# Patient Record
Sex: Male | Born: 1940 | Race: White | Hispanic: No | Marital: Married
Health system: Southern US, Community
[De-identification: ages and names within clinical notes are randomized; demographics above are authoritative.]

## PROBLEM LIST (undated history)

## (undated) DIAGNOSIS — T148XXA Other injury of unspecified body region, initial encounter: Secondary | ICD-10-CM

## (undated) DIAGNOSIS — K644 Residual hemorrhoidal skin tags: Secondary | ICD-10-CM

## (undated) DIAGNOSIS — F32A Depression, unspecified: Secondary | ICD-10-CM

## (undated) DIAGNOSIS — K219 Gastro-esophageal reflux disease without esophagitis: Secondary | ICD-10-CM

## (undated) DIAGNOSIS — J189 Pneumonia, unspecified organism: Secondary | ICD-10-CM

## (undated) DIAGNOSIS — J310 Chronic rhinitis: Secondary | ICD-10-CM

## (undated) DIAGNOSIS — E785 Hyperlipidemia, unspecified: Secondary | ICD-10-CM

## (undated) DIAGNOSIS — M109 Gout, unspecified: Secondary | ICD-10-CM

## (undated) DIAGNOSIS — R413 Other amnesia: Secondary | ICD-10-CM

## (undated) DIAGNOSIS — F329 Major depressive disorder, single episode, unspecified: Secondary | ICD-10-CM

## (undated) DIAGNOSIS — I2510 Atherosclerotic heart disease of native coronary artery without angina pectoris: Secondary | ICD-10-CM

## (undated) DIAGNOSIS — Z9889 Other specified postprocedural states: Secondary | ICD-10-CM

## (undated) DIAGNOSIS — I10 Essential (primary) hypertension: Secondary | ICD-10-CM

## (undated) DIAGNOSIS — M7550 Bursitis of unspecified shoulder: Secondary | ICD-10-CM

## (undated) DIAGNOSIS — R351 Nocturia: Secondary | ICD-10-CM

## (undated) DIAGNOSIS — M4300 Spondylolysis, site unspecified: Secondary | ICD-10-CM

## (undated) DIAGNOSIS — F419 Anxiety disorder, unspecified: Secondary | ICD-10-CM

## (undated) DIAGNOSIS — H269 Unspecified cataract: Secondary | ICD-10-CM

## (undated) DIAGNOSIS — Z972 Presence of dental prosthetic device (complete) (partial): Secondary | ICD-10-CM

## (undated) DIAGNOSIS — E663 Overweight: Secondary | ICD-10-CM

## (undated) DIAGNOSIS — R7303 Prediabetes: Secondary | ICD-10-CM

## (undated) DIAGNOSIS — K859 Acute pancreatitis without necrosis or infection, unspecified: Secondary | ICD-10-CM

## (undated) DIAGNOSIS — D126 Benign neoplasm of colon, unspecified: Secondary | ICD-10-CM

## (undated) DIAGNOSIS — T7840XA Allergy, unspecified, initial encounter: Secondary | ICD-10-CM

## (undated) DIAGNOSIS — Z8719 Personal history of other diseases of the digestive system: Secondary | ICD-10-CM

## (undated) HISTORY — DX: Presence of dental prosthetic device (complete) (partial): Z97.2

## (undated) HISTORY — DX: Gout, unspecified: M10.9

## (undated) HISTORY — DX: Essential (primary) hypertension: I10

## (undated) HISTORY — DX: Depression, unspecified: F32.A

## (undated) HISTORY — DX: Other specified postprocedural states: Z98.89

## (undated) HISTORY — DX: Pneumonia, unspecified organism: J18.9

## (undated) HISTORY — DX: Spondylolysis, site unspecified: M43.00

## (undated) HISTORY — DX: Acute pancreatitis without necrosis or infection, unspecified: K85.90

## (undated) HISTORY — DX: Gastro-esophageal reflux disease without esophagitis: K21.9

## (undated) HISTORY — DX: Hyperlipidemia, unspecified: E78.5

## (undated) HISTORY — PX: POLYPECTOMY: SHX149

## (undated) HISTORY — DX: Unspecified cataract: H26.9

## (undated) HISTORY — DX: Residual hemorrhoidal skin tags: K64.4

## (undated) HISTORY — DX: Other injury of unspecified body region, initial encounter: T14.8XXA

## (undated) HISTORY — DX: Atherosclerotic heart disease of native coronary artery without angina pectoris: I25.10

## (undated) HISTORY — PX: OTHER SURGICAL HISTORY: SHX169

## (undated) HISTORY — DX: Nocturia: R35.1

## (undated) HISTORY — DX: Bursitis of unspecified shoulder: M75.50

## (undated) HISTORY — PX: UMBILICAL HERNIA REPAIR: SHX196

## (undated) HISTORY — PX: TONSILLECTOMY: SUR1361

## (undated) HISTORY — DX: Chronic rhinitis: J31.0

## (undated) HISTORY — PX: CARPAL TUNNEL RELEASE: SHX101

## (undated) HISTORY — PX: UPPER GASTROINTESTINAL ENDOSCOPY: SHX188

## (undated) HISTORY — DX: Benign neoplasm of colon, unspecified: D12.6

## (undated) HISTORY — DX: Major depressive disorder, single episode, unspecified: F32.9

## (undated) HISTORY — DX: Allergy, unspecified, initial encounter: T78.40XA

## (undated) HISTORY — DX: Prediabetes: R73.03

## (undated) HISTORY — PX: CHOLECYSTECTOMY: SHX55

## (undated) HISTORY — PX: COLONOSCOPY: SHX174

## (undated) HISTORY — DX: Overweight: E66.3

## (undated) HISTORY — DX: Anxiety disorder, unspecified: F41.9

## (undated) HISTORY — DX: Personal history of other diseases of the digestive system: Z87.19

## (undated) HISTORY — DX: Other amnesia: R41.3

---

## 1982-02-03 HISTORY — PX: UPPER GI ENDOSCOPY: SHX6162

## 1997-06-01 ENCOUNTER — Ambulatory Visit (HOSPITAL_COMMUNITY): Admission: RE | Admit: 1997-06-01 | Discharge: 1997-06-01 | Payer: Self-pay | Admitting: Internal Medicine

## 1998-05-28 ENCOUNTER — Encounter: Payer: Self-pay | Admitting: Internal Medicine

## 1998-05-28 ENCOUNTER — Ambulatory Visit (HOSPITAL_COMMUNITY): Admission: RE | Admit: 1998-05-28 | Discharge: 1998-05-28 | Payer: Self-pay | Admitting: Internal Medicine

## 1999-01-08 ENCOUNTER — Ambulatory Visit (HOSPITAL_COMMUNITY): Admission: RE | Admit: 1999-01-08 | Discharge: 1999-01-08 | Payer: Self-pay | Admitting: Internal Medicine

## 1999-01-08 ENCOUNTER — Encounter: Payer: Self-pay | Admitting: Internal Medicine

## 2001-02-08 ENCOUNTER — Emergency Department (HOSPITAL_COMMUNITY): Admission: EM | Admit: 2001-02-08 | Discharge: 2001-02-08 | Payer: Self-pay | Admitting: Emergency Medicine

## 2001-02-08 ENCOUNTER — Encounter: Payer: Self-pay | Admitting: Emergency Medicine

## 2001-03-05 ENCOUNTER — Ambulatory Visit (HOSPITAL_BASED_OUTPATIENT_CLINIC_OR_DEPARTMENT_OTHER): Admission: RE | Admit: 2001-03-05 | Discharge: 2001-03-05 | Payer: Self-pay | Admitting: Internal Medicine

## 2001-10-29 ENCOUNTER — Encounter: Payer: Self-pay | Admitting: Cardiology

## 2001-10-30 ENCOUNTER — Inpatient Hospital Stay (HOSPITAL_COMMUNITY): Admission: AD | Admit: 2001-10-30 | Discharge: 2001-10-31 | Payer: Self-pay | Admitting: Cardiology

## 2002-03-07 ENCOUNTER — Encounter: Payer: Self-pay | Admitting: Emergency Medicine

## 2002-03-07 ENCOUNTER — Inpatient Hospital Stay (HOSPITAL_COMMUNITY): Admission: EM | Admit: 2002-03-07 | Discharge: 2002-03-08 | Payer: Self-pay | Admitting: Emergency Medicine

## 2002-03-08 ENCOUNTER — Encounter: Payer: Self-pay | Admitting: Internal Medicine

## 2002-04-07 ENCOUNTER — Encounter: Admission: RE | Admit: 2002-04-07 | Discharge: 2002-04-07 | Payer: Self-pay | Admitting: Internal Medicine

## 2002-04-08 ENCOUNTER — Encounter: Payer: Self-pay | Admitting: Internal Medicine

## 2002-04-08 ENCOUNTER — Encounter: Admission: RE | Admit: 2002-04-08 | Discharge: 2002-04-08 | Payer: Self-pay | Admitting: Internal Medicine

## 2002-09-01 ENCOUNTER — Encounter: Payer: Self-pay | Admitting: Internal Medicine

## 2002-12-12 ENCOUNTER — Ambulatory Visit (HOSPITAL_COMMUNITY): Admission: RE | Admit: 2002-12-12 | Discharge: 2002-12-12 | Payer: Self-pay | Admitting: Internal Medicine

## 2003-02-04 HISTORY — PX: CERVICAL SPINE SURGERY: SHX589

## 2003-02-04 HISTORY — PX: SPINE SURGERY: SHX786

## 2003-08-04 ENCOUNTER — Encounter: Admission: RE | Admit: 2003-08-04 | Discharge: 2003-08-04 | Payer: Self-pay | Admitting: Orthopaedic Surgery

## 2003-12-08 ENCOUNTER — Ambulatory Visit: Payer: Self-pay | Admitting: Internal Medicine

## 2004-05-06 ENCOUNTER — Ambulatory Visit: Payer: Self-pay | Admitting: Internal Medicine

## 2004-07-02 ENCOUNTER — Ambulatory Visit: Payer: Self-pay | Admitting: Internal Medicine

## 2004-07-05 ENCOUNTER — Ambulatory Visit: Payer: Self-pay | Admitting: Internal Medicine

## 2004-07-09 ENCOUNTER — Ambulatory Visit: Payer: Self-pay

## 2004-07-10 ENCOUNTER — Ambulatory Visit: Payer: Self-pay | Admitting: Cardiology

## 2004-07-22 ENCOUNTER — Ambulatory Visit (HOSPITAL_COMMUNITY): Admission: RE | Admit: 2004-07-22 | Discharge: 2004-07-22 | Payer: Self-pay | Admitting: Neurology

## 2004-08-13 ENCOUNTER — Ambulatory Visit: Payer: Self-pay

## 2004-08-21 ENCOUNTER — Ambulatory Visit: Payer: Self-pay | Admitting: Cardiology

## 2004-08-27 ENCOUNTER — Inpatient Hospital Stay (HOSPITAL_COMMUNITY): Admission: EM | Admit: 2004-08-27 | Discharge: 2004-08-29 | Payer: Self-pay | Admitting: Emergency Medicine

## 2004-08-28 ENCOUNTER — Ambulatory Visit: Payer: Self-pay | Admitting: Internal Medicine

## 2004-09-10 ENCOUNTER — Ambulatory Visit: Payer: Self-pay | Admitting: Internal Medicine

## 2004-10-23 ENCOUNTER — Ambulatory Visit: Payer: Self-pay | Admitting: Cardiology

## 2005-03-04 ENCOUNTER — Ambulatory Visit: Payer: Self-pay | Admitting: Cardiology

## 2005-03-31 ENCOUNTER — Ambulatory Visit: Payer: Self-pay | Admitting: Cardiology

## 2005-03-31 ENCOUNTER — Ambulatory Visit: Payer: Self-pay

## 2005-05-30 ENCOUNTER — Ambulatory Visit: Payer: Self-pay | Admitting: Cardiology

## 2005-07-24 ENCOUNTER — Ambulatory Visit: Payer: Self-pay | Admitting: Emergency Medicine

## 2005-07-24 ENCOUNTER — Inpatient Hospital Stay (HOSPITAL_COMMUNITY): Admission: RE | Admit: 2005-07-24 | Discharge: 2005-08-11 | Payer: Self-pay | Admitting: Orthopaedic Surgery

## 2005-07-27 ENCOUNTER — Ambulatory Visit: Payer: Self-pay | Admitting: Infectious Diseases

## 2005-08-07 ENCOUNTER — Ambulatory Visit: Payer: Self-pay | Admitting: Physical Medicine & Rehabilitation

## 2005-08-11 ENCOUNTER — Inpatient Hospital Stay (HOSPITAL_COMMUNITY)
Admission: RE | Admit: 2005-08-11 | Discharge: 2005-08-15 | Payer: Self-pay | Admitting: Physical Medicine & Rehabilitation

## 2005-09-01 ENCOUNTER — Encounter: Payer: Self-pay | Admitting: Cardiology

## 2005-10-16 ENCOUNTER — Ambulatory Visit: Payer: Self-pay | Admitting: Internal Medicine

## 2005-11-11 ENCOUNTER — Ambulatory Visit: Payer: Self-pay | Admitting: Internal Medicine

## 2005-11-24 ENCOUNTER — Ambulatory Visit: Payer: Self-pay | Admitting: Internal Medicine

## 2007-04-13 ENCOUNTER — Encounter: Payer: Self-pay | Admitting: *Deleted

## 2007-04-13 DIAGNOSIS — M109 Gout, unspecified: Secondary | ICD-10-CM

## 2007-04-13 DIAGNOSIS — J309 Allergic rhinitis, unspecified: Secondary | ICD-10-CM | POA: Insufficient documentation

## 2007-04-13 DIAGNOSIS — I10 Essential (primary) hypertension: Secondary | ICD-10-CM | POA: Insufficient documentation

## 2007-04-13 DIAGNOSIS — Z9089 Acquired absence of other organs: Secondary | ICD-10-CM | POA: Insufficient documentation

## 2007-04-13 DIAGNOSIS — Z9889 Other specified postprocedural states: Secondary | ICD-10-CM

## 2007-04-13 DIAGNOSIS — D126 Benign neoplasm of colon, unspecified: Secondary | ICD-10-CM

## 2007-04-13 DIAGNOSIS — I2510 Atherosclerotic heart disease of native coronary artery without angina pectoris: Secondary | ICD-10-CM | POA: Insufficient documentation

## 2007-04-13 DIAGNOSIS — M67919 Unspecified disorder of synovium and tendon, unspecified shoulder: Secondary | ICD-10-CM | POA: Insufficient documentation

## 2007-04-13 DIAGNOSIS — Z9861 Coronary angioplasty status: Secondary | ICD-10-CM | POA: Insufficient documentation

## 2007-04-13 DIAGNOSIS — Z8719 Personal history of other diseases of the digestive system: Secondary | ICD-10-CM | POA: Insufficient documentation

## 2007-04-13 DIAGNOSIS — K644 Residual hemorrhoidal skin tags: Secondary | ICD-10-CM | POA: Insufficient documentation

## 2007-04-13 DIAGNOSIS — Z9189 Other specified personal risk factors, not elsewhere classified: Secondary | ICD-10-CM | POA: Insufficient documentation

## 2007-04-13 DIAGNOSIS — M719 Bursopathy, unspecified: Secondary | ICD-10-CM

## 2007-04-13 DIAGNOSIS — E785 Hyperlipidemia, unspecified: Secondary | ICD-10-CM | POA: Insufficient documentation

## 2007-04-13 DIAGNOSIS — R413 Other amnesia: Secondary | ICD-10-CM | POA: Insufficient documentation

## 2007-04-13 DIAGNOSIS — F411 Generalized anxiety disorder: Secondary | ICD-10-CM | POA: Insufficient documentation

## 2007-04-13 DIAGNOSIS — K219 Gastro-esophageal reflux disease without esophagitis: Secondary | ICD-10-CM | POA: Insufficient documentation

## 2007-08-24 ENCOUNTER — Ambulatory Visit: Payer: Self-pay | Admitting: Gastroenterology

## 2007-08-24 DIAGNOSIS — R159 Full incontinence of feces: Secondary | ICD-10-CM | POA: Insufficient documentation

## 2007-09-07 ENCOUNTER — Ambulatory Visit (HOSPITAL_COMMUNITY): Admission: RE | Admit: 2007-09-07 | Discharge: 2007-09-07 | Payer: Self-pay | Admitting: Gastroenterology

## 2007-09-07 ENCOUNTER — Encounter: Payer: Self-pay | Admitting: Internal Medicine

## 2007-09-08 ENCOUNTER — Encounter: Payer: Self-pay | Admitting: Gastroenterology

## 2007-09-13 ENCOUNTER — Ambulatory Visit: Payer: Self-pay | Admitting: Gastroenterology

## 2008-01-30 IMAGING — CR DG CHEST 2V
2 series · 2 of 2 positions shown · non-contrast
Comparison: none

CLINICAL DATA: Cervical spondylosis.  Preoperative.
 CHEST ? 2 VIEW:

[view not recorded (1 of 2)]
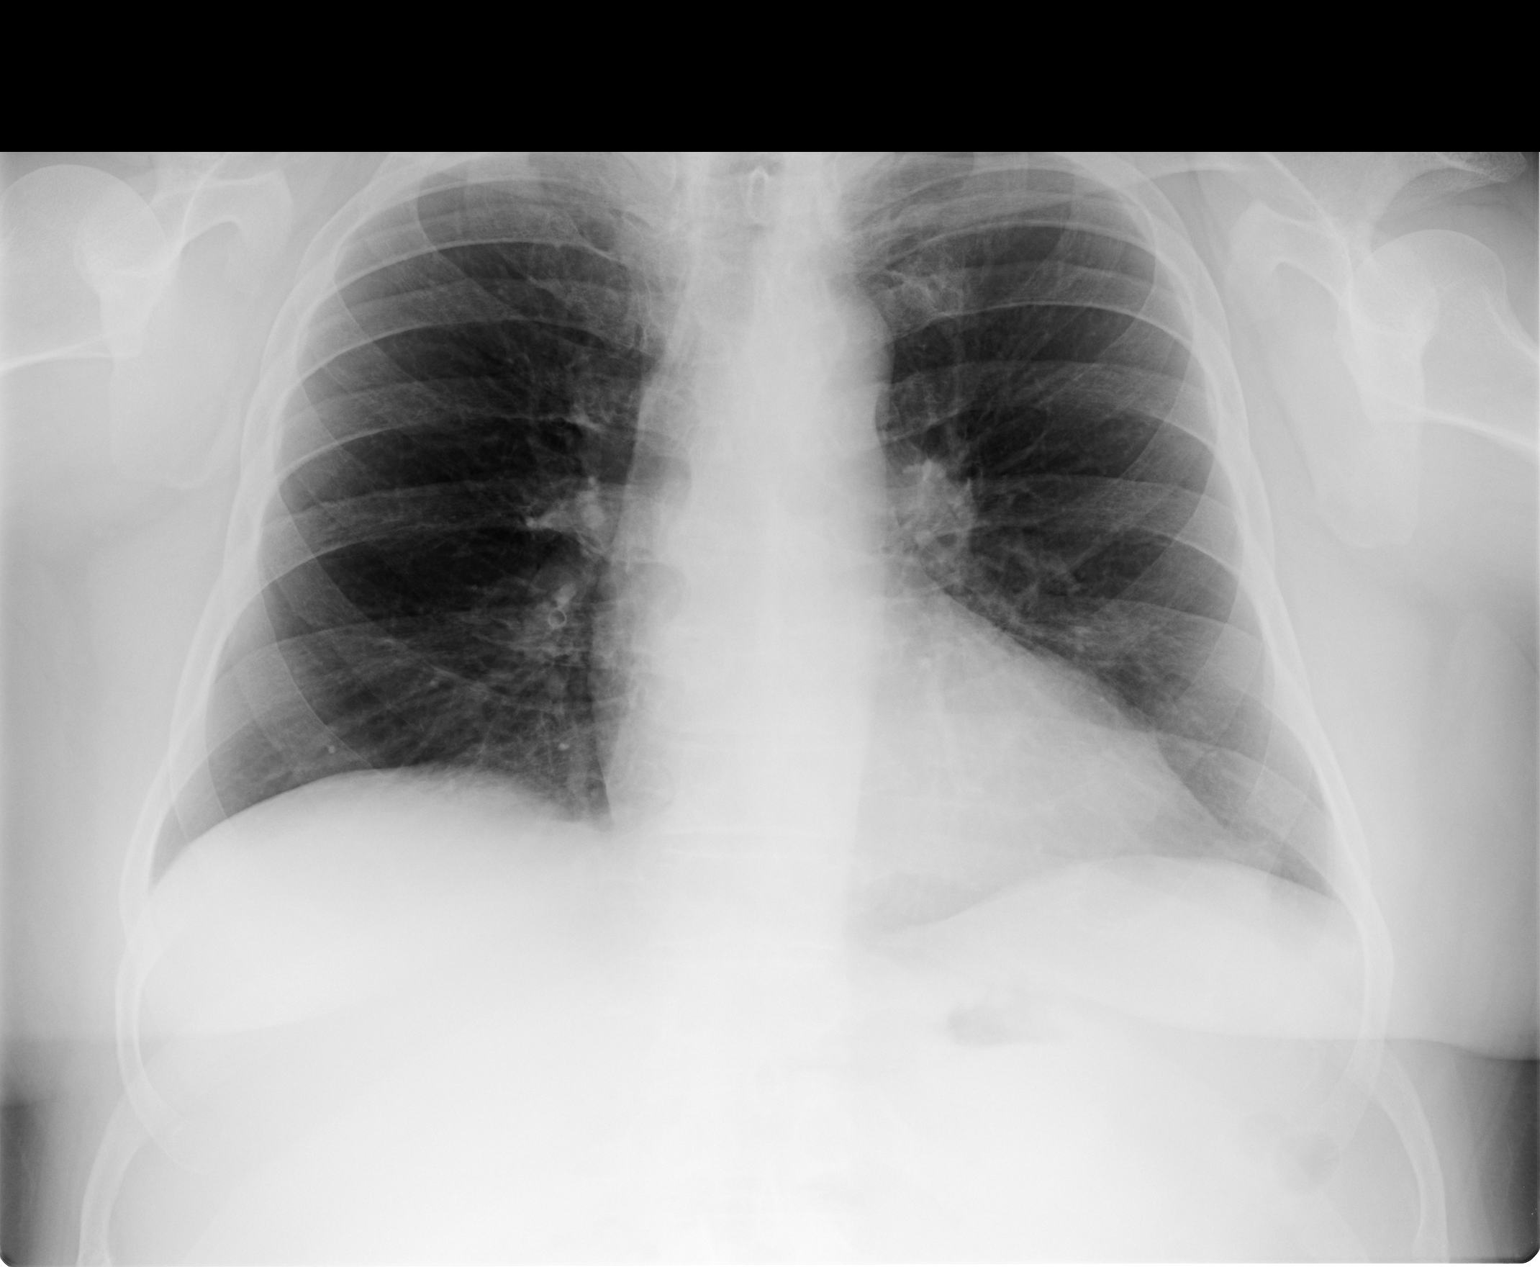

[view not recorded (2 of 2)]
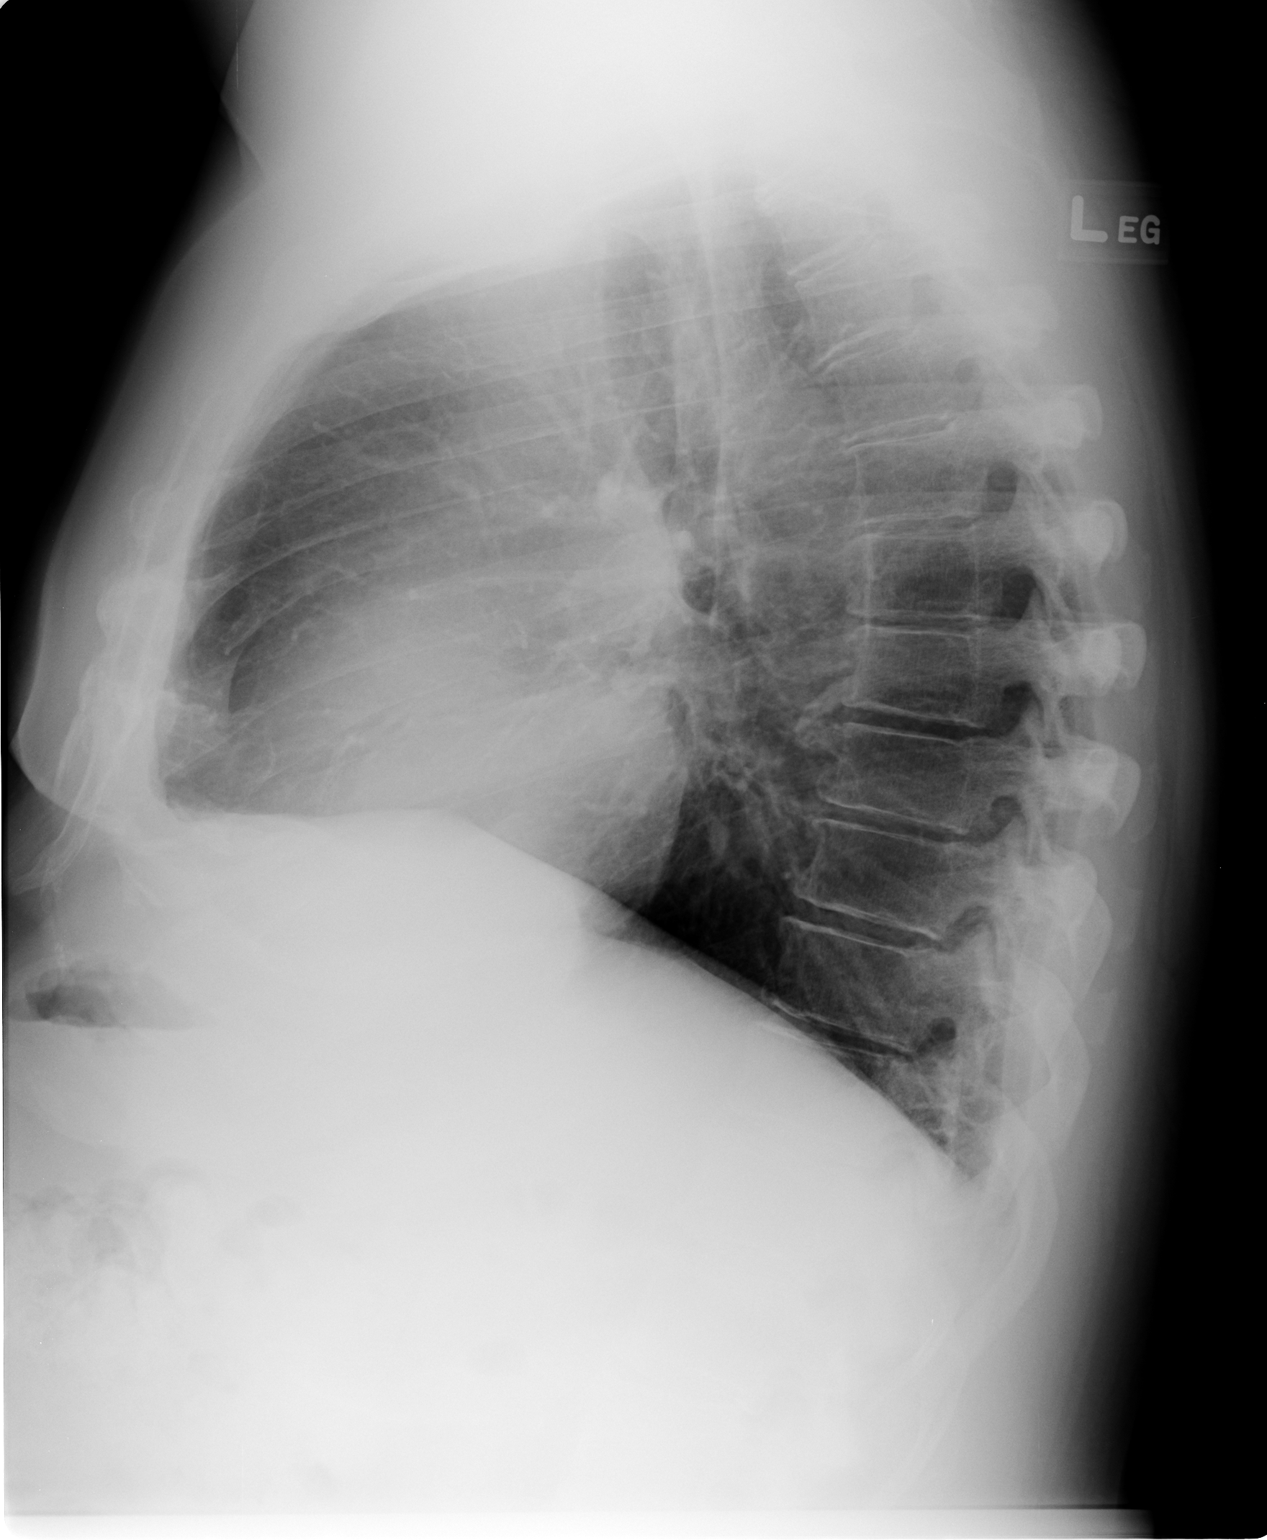

[2 of 2 positions shown; findings below may reference images not displayed]

FINDINGS: The lungs are clear and the heart and mediastinal structures are normal.
IMPRESSION: No evidence for active chest disease.

## 2008-05-04 ENCOUNTER — Encounter: Payer: Self-pay | Admitting: Cardiology

## 2008-08-03 ENCOUNTER — Ambulatory Visit: Payer: Self-pay | Admitting: Cardiology

## 2008-08-10 ENCOUNTER — Encounter: Payer: Self-pay | Admitting: Cardiology

## 2008-08-10 ENCOUNTER — Ambulatory Visit: Payer: Self-pay | Admitting: Cardiology

## 2008-11-20 DIAGNOSIS — E663 Overweight: Secondary | ICD-10-CM | POA: Insufficient documentation

## 2009-03-27 ENCOUNTER — Ambulatory Visit: Payer: Self-pay | Admitting: Cardiology

## 2010-01-17 ENCOUNTER — Encounter: Payer: Self-pay | Admitting: Cardiology

## 2010-02-05 ENCOUNTER — Telehealth (INDEPENDENT_AMBULATORY_CARE_PROVIDER_SITE_OTHER): Payer: Self-pay | Admitting: *Deleted

## 2010-02-05 ENCOUNTER — Encounter: Payer: Self-pay | Admitting: Cardiology

## 2010-02-06 ENCOUNTER — Encounter: Payer: Self-pay | Admitting: Cardiology

## 2010-02-11 ENCOUNTER — Encounter: Payer: Self-pay | Admitting: Cardiology

## 2010-02-19 ENCOUNTER — Ambulatory Visit
Admission: RE | Admit: 2010-02-19 | Discharge: 2010-02-19 | Payer: Self-pay | Source: Home / Self Care | Attending: Cardiology | Admitting: Cardiology

## 2010-02-19 ENCOUNTER — Encounter: Payer: Self-pay | Admitting: Cardiology

## 2010-02-19 ENCOUNTER — Encounter (INDEPENDENT_AMBULATORY_CARE_PROVIDER_SITE_OTHER): Payer: Self-pay | Admitting: *Deleted

## 2010-02-19 DIAGNOSIS — R0602 Shortness of breath: Secondary | ICD-10-CM | POA: Insufficient documentation

## 2010-02-19 DIAGNOSIS — R9430 Abnormal result of cardiovascular function study, unspecified: Secondary | ICD-10-CM | POA: Insufficient documentation

## 2010-02-20 ENCOUNTER — Encounter: Payer: Self-pay | Admitting: Cardiology

## 2010-02-21 ENCOUNTER — Ambulatory Visit
Admission: RE | Admit: 2010-02-21 | Payer: Self-pay | Source: Home / Self Care | Attending: Cardiology | Admitting: Cardiology

## 2010-02-24 ENCOUNTER — Encounter: Payer: Self-pay | Admitting: Physical Medicine & Rehabilitation

## 2010-02-25 LAB — POCT I-STAT 3, ART BLOOD GAS (G3+)
O2 Saturation: 97 %
TCO2: 27 mmol/L (ref 0–100)
pH, Arterial: 7.453 — ABNORMAL HIGH (ref 7.350–7.450)
pO2, Arterial: 86 mmHg (ref 80.0–100.0)

## 2010-02-25 LAB — POCT I-STAT 3, VENOUS BLOOD GAS (G3P V)
Acid-Base Excess: 2 mmol/L (ref 0.0–2.0)
Acid-Base Excess: 3 mmol/L — ABNORMAL HIGH (ref 0.0–2.0)
Bicarbonate: 28 mEq/L — ABNORMAL HIGH (ref 20.0–24.0)
O2 Saturation: 64 %
pCO2, Ven: 49.7 mmHg (ref 45.0–50.0)
pH, Ven: 7.373 — ABNORMAL HIGH (ref 7.250–7.300)
pO2, Ven: 28 mmHg — CL (ref 30.0–45.0)

## 2010-02-26 ENCOUNTER — Ambulatory Visit (HOSPITAL_COMMUNITY)
Admission: RE | Admit: 2010-02-26 | Discharge: 2010-02-27 | Payer: Self-pay | Source: Home / Self Care | Attending: Cardiovascular Disease | Admitting: Cardiovascular Disease

## 2010-02-27 ENCOUNTER — Encounter: Payer: Self-pay | Admitting: Cardiology

## 2010-02-27 LAB — BASIC METABOLIC PANEL
CO2: 28 mEq/L (ref 19–32)
Calcium: 9 mg/dL (ref 8.4–10.5)
Chloride: 105 mEq/L (ref 96–112)
Chloride: 106 mEq/L (ref 96–112)
Creatinine, Ser: 1.17 mg/dL (ref 0.4–1.5)
Creatinine, Ser: 1.2 mg/dL (ref 0.4–1.5)
GFR calc Af Amer: >60 mL/min (ref >60)
GFR calc non Af Amer: >60 mL/min (ref >60)
Glucose, Bld: 143 mg/dL — ABNORMAL HIGH (ref 70–99)
Glucose, Bld: 146 mg/dL — ABNORMAL HIGH (ref 70–99)
Potassium: 4.6 mEq/L (ref 3.5–5.1)
Sodium: 140 mEq/L (ref 135–145)

## 2010-02-27 LAB — CBC
HCT: 44 % (ref 39.0–52.0)
MCH: 30.1 pg (ref 26.0–34.0)
MCHC: 32.9 g/dL (ref 30.0–36.0)
Platelets: 243 10*3/uL (ref 150–400)
RDW: 12.9 % (ref 11.5–15.5)
WBC: 7.7 10*3/uL (ref 4.0–10.5)
WBC: 9.2 10*3/uL (ref 4.0–10.5)

## 2010-02-28 LAB — POCT ACTIVATED CLOTTING TIME: Activated Clotting Time: 505 seconds

## 2010-02-28 NOTE — Procedures (Addendum)
  NAMEREMBERTO, LINEN NO.:  [id number]  MEDICAL RECORD NO.:  [id number]          PATIENT TYPE:  OIB  LOCATION:  1966                         FACILITY:  [facility]  PHYSICIAN:  Minus Breeding, MD, FACCDATE OF BIRTH:  1941-01-08  DATE OF PROCEDURE:  02/21/2010 DATE OF DISCHARGE:  02/21/2010                           CARDIAC CATHETERIZATION   PRIMARY CARE PHYSICIAN:  Dr. Olena Heckle.  CARDIOLOGIST:  Ernestine Mcmurray, MD, [facility].  PROCEDURE:  Left and right heart catheterization/coronary arteriography.  INDICATIONS:  Evaluate the patient with dyspnea and an abnormal stress perfusion study suggesting mid-to-basilar inferolateral defect.  PROCEDURAL NOTE:  Left heart catheterization performed via the right femoral artery, right heart catheterization was performed via the right femoral vein.  Both vessels were cannulated using anterior wall puncture.  A #4-French arterial sheath and a #7-French venous sheath were inserted via the modified Seldinger technique.  Preformed Judkins and pigtail catheter were utilized.  The patient tolerated the procedure well, left the lab in stable condition.  RESULTS:  Hemodynamics:  RA mean 9, RV 23/8, PA 24/14 with a mean of 19, pulmonary capillary pressure mean 9, AO 120/92, LV 132/16, cardiac output/cardiac index 3.2/1.4.  Coronaries:  Left main was normal.  The LAD had a long proximal 60% calcified stenosis.  The vessel did not wrap the apex.  This was slightly progressed compared with the 2003 examination.  First diagonal was large with mid long 25% stenosis.  Circumflex in the AV groove was normal proximally.  There was mid obtuse marginal, which was large and branching.  Superior branch was normal.  The inferior branch had 25% stenosis.  The right coronary artery was a large vessel with a proximal mid stent, which was patent.  At the distal end of the stent, there was approximately 75% stenosis.  PDA had ostial 50% stenosis and was  large. Posterolateral had ostial 40% stenosis and was large.  Left Ventriculogram:  The left ventriculogram was obtained in the RAO projection.  EF was 65% with normal wall motion.  CONCLUSION:  Significant right coronary artery stenosis in the phase of an abnormal stress perfusion study and symptoms.  The patient will be brought back for percutaneous revascularization.  The films were repaired with Dr. Dannielle Burn and Dr. Burt Knack.     Minus Breeding, MD, [facility]     JH/MEDQ  D:  02/21/2010  T:  02/22/2010  Job:  [id number]  cc:   Dr. Olena Heckle.  Electronically Signed by Minus Breeding MD [facility] on 02/28/2010 12:29:54 PM

## 2010-03-04 ENCOUNTER — Telehealth (INDEPENDENT_AMBULATORY_CARE_PROVIDER_SITE_OTHER): Payer: Self-pay | Admitting: *Deleted

## 2010-03-05 NOTE — Assessment & Plan Note (Signed)
Summary: 6 mo fu per jan reminder-srs  Medications Added COZAAR 100 MG TABS (LOSARTAN POTASSIUM) Take 1 tablet by mouth once a day      Allergies Added: NKDA  Visit Type:  Follow-up Primary Provider:  Kern Alberta, MD  CC:  follow-up visit.  History of Present Illness: the patient is a 70 year old male with a history of catheterization in 2003. The patient has coronary disease requiring stent placement to a right coronary artery lesion. He had nonobstructive disease in the LAD and circumflex. The patient denies any recurrent chest pain. He denies shortness of breath orthopnea PND he has no palpitations or syncope.  The patient reports pain in the lower extremities upon walking but only occasionally and not always reproducible. He reports no sensation abnormalities in the lower extremities. He has no back pain.  The patient reports no ongoing anxiety. His sleep pattern has improved.  Clinical Review Panels:  CXR CXR results Mild cardiac prominence.   Normal mediastinal contours and pulmonary vascularity.   Minimal chronic elevation right diaphragm.   Linear subsegmental atelectasis right base.   Lungs otherwise clear.   Significant improvement in aeration at lung bases since previous exam.   Multiple cardiac monitoring lines project over chest.   No pneumothorax.   Prior cervical spine fusion.    IMPRESSION:   Improved bibasilar aeration. (08/08/2005)  Stress Echocardiogram Stress Echocardiogram Conclusions:         1. The study quality is fair.  The study is technically limited due to         patient body habitus.          2. Patient followed a Bruce protocol. The patient exercised into stage         3. The total exercise duration was 06:07 minutes with adequte heart         rate achieved. There are non-specific ST-segment changes. The blood         pressure response is hypertensive. The patient did not express         feelings of chest discomfort. The patient achieved a  level of 7 METS.         3. This was an equivocal echocardiographic stress test with limited        apical views.  At rest there is possible apical inferior wall segment         hypokinesis (score 2). The estimated ejection fraction is 55-60%.          With stress the left ventricle appears hyperdynamic. The estimated         ejection fraction is greater than 65%.  The apical inferior wall         segment is unchanged based on limited views. (08/10/2008)  Cardiac Imaging Cardiac Cath Findings   CONCLUSION:  Successful percutaneous stenting of the right coronary artery.   DISPOSITION:  The patient had a drug eluding Cypher stent. Oral Plavix for  at least three months will be recommended. Aggressive medical management for  risk reduction and exercise tolerance testing to assess ischemia in other  myocardial territories as well as the distal right coronary will be  recommended.                                            Loretha Brasil. Lia Foyer, M.D. [facility] (10/29/2001)    Preventive Screening-Counseling & Management  Alcohol-Tobacco     Smoking Status: never  Current Medications (verified): 1)  Probenecid 500 Mg  Tabs (Probenecid) .... Take One Tablet Two Times A Day 2)  Fish Oil   Oil (Fish Oil) .... Take Daily As Directed. 3)  Cozaar 100 Mg Tabs (Losartan Potassium) .... Take 1 Tablet By Mouth Once A Day 4)  Aspirin 81 Mg  Tbec (Aspirin) .... Once Daily 5)  Trazodone Hcl 50 Mg  Tabs (Trazodone Hcl) .... Take Two Times A Day 6)  Zetia 10 Mg  Tabs (Ezetimibe) .... Take One Tablet Once Daily 7)  Lorazepam 1 Mg  Tabs (Lorazepam) .... Take One Tablet As Needed 8)  Zocor 40 Mg  Tabs (Simvastatin) .... Once Daily 9)  Omeprazole 20 Mg  Cpdr (Omeprazole) .... Two Times A Day 10)  Anusol-Hc 25 Mg  Supp (Hydrocortisone Acetate) .... One Suppository  P.o. R. Q.h.s. 11)  Citalopram Hydrobromide 20 Mg Tabs (Citalopram Hydrobromide) .... Take 1 Tablet By Mouth Once A Day  Allergies (verified): No  Known Drug Allergies  Comments:  Nurse/Medical Assistant: The patient's medications and allergies were reviewed with the patient and were updated in the Medication and Allergy Lists. List reviewed.  Past History:  Family History: Last updated: 08/24/2007 Family History of Clotting disorder: Father Family History of Diabetes: Mother Family History of Heart Disease: Mother  Social History: Last updated: 08/24/2007 Occupation: Disabled Patient has never smoked.  Alcohol Use - no Daily Caffeine Use - 2 cups coffee Illicit Drug Use - no  Risk Factors: Smoking Status: never (03/27/2009)  Review of Systems  The patient denies fatigue, malaise, fever, weight gain/loss, vision loss, decreased hearing, hoarseness, chest pain, palpitations, shortness of breath, prolonged cough, wheezing, sleep apnea, coughing up blood, abdominal pain, blood in stool, nausea, vomiting, diarrhea, heartburn, incontinence, blood in urine, muscle weakness, joint pain, leg swelling, rash, skin lesions, headache, fainting, dizziness, depression, anxiety, enlarged lymph nodes, easy bruising or bleeding, and environmental allergies.    Vital Signs:  Patient profile:   70 year old male Height:      68 inches Weight:      251 pounds BMI:     38.30 Pulse rate:   71 / minute BP sitting:   115 / 74  (left arm) Cuff size:   large  Vitals Entered By: Georgina Peer (March 27, 2009 11:15 AM)  Nutrition Counseling: Patient's BMI is greater than 25 and therefore counseled on weight management options. CC: follow-up visit   Physical Exam  Additional Exam:  General: Well-developed, well-nourished in no distress head: Normocephalic and atraumatic eyes PERRLA/EOMI intact, conjunctiva and lids normal nose: No deformity or lesions mouth normal dentition, normal posterior pharynx neck: Supple, no JVD.  No masses, thyromegaly or abnormal cervical nodes lungs: Normal breath sounds bilaterally without wheezing.   Normal percussion heart: regular rate and rhythm with normal S1 and S2, no S3 or S4.  PMI is normal.  No pathological murmurs abdomen: Normal bowel sounds, abdomen is soft and nontender without masses, organomegaly or hernias noted.  No hepatosplenomegaly musculoskeletal: Back normal, normal gait muscle strength and tone normal pulsus: Pulse is normal in all 4 extremities Extremities: No peripheral pitting edema neurologic: Alert and oriented x 3 skin: Intact without lesions or rashes cervical nodes: No significant adenopathy psychologic: Normal affect    EKG  Procedure date:  03/27/2009  Findings:      normal sinus rhythm. Normal EKG. Heart rate 69 beats per minute.  Impression & Recommendations:  Problem #  1:  PERCUTANEOUS TRANSLUMINAL CORONARY ANGIOPLASTY, HX OF (ICD-V45.82) the patient reports no recurrent chest pain. His electrocardiogram is within normal limits. His compliant with his medications. There is no definite indication for stress testing currently.  Problem # 2:  ANXIETY, CHRONIC (ICD-300.00) this problem is much improved. The patient is followed by Dr. Quillian Quince.  Problem # 3:  HYPERTENSION (ICD-401.9) patient blood pressure within normal range. I made no changes in his medical regimen. His updated medication list for this problem includes:    Cozaar 100 Mg Tabs (Losartan potassium) .Marland Kitchen... Take 1 tablet by mouth once a day    Aspirin 81 Mg Tbec (Aspirin) ..... Once daily  Other Orders: EKG w/ Interpretation (93000)  Patient Instructions: 1)  Your physician recommends that you continue on your current medications as directed. Please refer to the Current Medication list given to you today. 2)  Follow up in  1 year.

## 2010-03-05 NOTE — Discharge Summary (Addendum)
NAMEBERKELEY, GOCHNOUR                ACCOUNT NO.:  [id number]  MEDICAL RECORD NO.:  [id number]          PATIENT TYPE:  OIB  LOCATION:  6527                         FACILITY:  [facility]  PHYSICIAN:  Juanda Bond. Burt Knack, MD  DATE OF BIRTH:  May 29, 1940  DATE OF ADMISSION:  02/26/2010 DATE OF DISCHARGE:  02/27/2010                              DISCHARGE SUMMARY   DISCHARGE DIAGNOSES: 1. Shortness of breath, felt anginal equivalent with diagnostic     catheterization on February 21, 2010, demonstrating significant     right coronary artery stenosis.     a.     Interventional cardiac catheterization performed on February 26, 2010 resulting in successful percutaneous coronary      intervention/Promus drug-eluting stent placement to the proximal      as well as mid right coronary artery. 2. Prior history of coronary artery disease with history of stenting     to the right coronary artery in 2003. 3. Ejection fraction of 65% by diagnostic catheterization on February 21, 2010. 4. Obesity. 5. Hyperlipidemia. 6. Gastroesophageal reflux disease. 7. Hypertension. 8. Elevated blood glucose, for outpatient evaluation for risk of     diabetes.  CBG was 146 here in the hospital.  SECONDARY DIAGNOSES: 1. History of cervical spine surgery complicated by evacuation of     hematoma with prolonged intubation in 2007. 2. Hemorrhoids. 3. Umbilical hernia repair. 4. Tonsillectomy. 5. Carpal tunnel release. 6. Gout. 7. Diverticulosis. 8. History of pancreatitis. 9. Chronic anxiety and depression.  HOSPITAL COURSE:  Mr. Fjerstad is a 70 year old gentleman with a history of hypertension and hyperlipidemia who had a stent placed in the RCA in 2003.  He presented with exertional dyspnea which was felt to be as anginal equivalent and had inferior ischemia on stress testing.  He underwent diagnostic catheterization on February 21, 2010, was found to have significant RCA stenosis.  EF was 65% at that visit.   He was started on Plavix as an outpatient as well as Imdur prior to the diagnostic catheterization and referred back for intervention on February 26, 2010.  Dr. Burt Knack performed successful PCI of the lesions in the proximal mid RCA using two Promus drug-eluting stents.  The patient should be continued on dual antiplatelet therapy with aspirin and Plavix for minimum of 1 year per his recommendations.  The patient tolerated the procedure well and ambulated without difficulty.  Dr. Burt Knack seen and examined him today and feels he is stable for discharge.  His home medicines will be continued, and after discussion with Dr. Burt Knack, the patient will also be started on metoprolol given his history of coronary artery disease as there is no other apparent contraindication at present.  DISCHARGE LABS:  WBC 9.2, hemoglobin 14, hematocrit 44, platelet count 243.  Sodium 141, potassium 5, chloride 106, CO2 28, glucose 46, BUN 11, creatinine 1.20.  STUDIES: 1. Cardiac catheterization on February 26, 2010, please see HPI for     summary as well as full report for details.  DISCHARGE MEDICATIONS: 1. Aspirin 81 mg daily. 2. Metoprolol tartrate 25 mg b.i.d.  3. Multaq Allergy one tablet every morning. 4. Cozaar 100 mg every morning. 5. Fish oil 1200 mg b.i.d. 6. Lasix 20 mg every morning. 7. Imdur 30 mg every morning. 8. Nitroglycerin sublingual 0.4 mg every 5 minutes as needed up to     three doses. 9. Plavix 75 mg every morning. 10.Probenecid/colchicine 500/0.5 mg b.i.d. 11.Vitamin B12 2500 mcg every morning. 12.Vitamin C 500 mg every morning. 13.Vitamin D3 400 units daily. 14.Vitamin E 400 units every morning. 15.Zocor 40 mg nightly.  DISPOSITION:  Mr. Blache will be discharged in stable condition to home. He is instructed not to lift anything or participate in sexual activity for 1 week or drive for 2 days.  He is to follow a low-sodium, heart- healthy diet and if he notices any pain,  swelling, bleeding or pus at his cath site, he is to call or return.  He is instructed to follow up with his primary care provider in the near future to evaluate for the rest of diabetes given his somewhat elevated blood sugar while here in the hospital.  A1c was not drawn.  He will also follow up with Dr. Dannielle Burn on March 19, 2010 at 8:30 a.m.  DURATION OF DISCHARGE ENCOUNTER:  Greater than 30 minutes including physician and PA time.     Melina Copa, P.A.C.   ______________________________ Juanda Bond. Burt Knack, MD    DD/MEDQ  D:  02/27/2010  T:  02/27/2010  Job:  [id number]  cc:   Ernestine Mcmurray, MD,FACC [facility]  Electronically Signed by Sherren Mocha MD on 03/05/2010 04:56:16 AM Electronically Signed by Melina Copa  on 03/06/2010 [id number] PM

## 2010-03-05 NOTE — Procedures (Signed)
Luis Wolfe, Luis Wolfe                ACCOUNT NO.:  [id number]  MEDICAL RECORD NO.:  [id number]          PATIENT TYPE:  OIB  LOCATION:  6527                         FACILITY:  [facility]  PHYSICIAN:  Juanda Bond. Burt Knack, MD  DATE OF BIRTH:  [birth year]  DATE OF PROCEDURE:  02/26/2010 DATE OF DISCHARGE:                           CARDIAC CATHETERIZATION   PROCEDURES: 1. Percutaneous transluminal coronary angioplasty and stenting of the     right coronary artery. 2. Perclose of the right femoral artery.  PROCEDURAL INDICATIONS:  Luis Wolfe is a 70 year old gentleman with known CAD and he has undergone previous stenting of the right coronary artery several years ago.  He presented with exertional dyspnea and was found to have inferior ischemia on stress testing.  He had significant right coronary artery stenosis with severe stenosis of the distal edge of the stent in the mid right coronary artery and moderately severe stenosis on the proximal aspect of the same stent in the proximal right coronary artery.  He was brought in for PCI in the setting of dyspnea and inducible ischemia on stress testing.  Risks and indications of procedure were reviewed with the patient. Informed consent was obtained.  The patient has been previously preloaded with Plavix and has been taking this medication daily.  Right groin was prepped, draped, and anesthetized with 1% lidocaine using modified Seldinger technique.  A 6-French sheath was placed in the right femoral artery.  Bivalirudin was used for anticoagulation.  A JR-4 guide catheter was inserted.  Once therapeutic ACT was achieved, a Cougar guidewire was advanced beyond the area of stenosis in the proximal mid vessel into the posterolateral branch.  The vessel was predilated with a 2.5 x 12 mm apex balloon which was taken to 12 atmospheres on 2 inflations.  A Promus drug-eluting stent was carefully positioned over the severe lesion of the distal edge of the  stent in the mid vessel and was deployed at 14 atmospheres.  This was a 2.75 x 16 mm stent.  The stent appeared well expanded.  The stent was then postdilated with a 3.0 x 15 mm Swan Quantum apex balloon which was taken to 20 atmospheres maximum inflation.  Following that portion of the procedure, there was an excellent angiographic result with 0% residual stenosis and TIMI 3 flow.  However, the proximal vessel clearly appeared hemodynamically significant at that point.  The guide catheter had to be deep seated in order to get the first stent down into the lesion.  There was no clear dissection, but I felt the vessel warranted treatment to keep the keep adequate inflow into the newly placed stent.  Therefore, the vessel was predilated with the same 3.0 x 15 Quantum apex which was taken to 10 and 12 atmospheres over the proximal portion of the vessel.  The vessel was then stented with a 3.0 x 20 mm Promus Element stent which was positioned in an overlapping fashion with old stent and was deployed at 16 atmospheres.  The proximal stent was then postdilated with a 3.25 x 20 mm Gonzalez Quantum apex balloon which was taken to 16 and 18 atmospheres  on sequential inflations.  There was an excellent angiographic result with 0% residual stenosis in the stented segment.  There was TIMI 3 flow at the completion of the procedure.  FINAL CONCLUSIONS:  Successful percutaneous intervention of lesions in the proximal and mid right coronary artery.  The mid right coronary artery lesion was taken from 80% to 0% with TIMI 3 flow pre and post using a 2.75 x 16 mm Promus stent.  The proximal lesion was taken from 75% to 0% using a 3.0 x 20 mm Promus stent with TIMI 3 flow pre and post.  RECOMMENDATIONS:  The patient should continue on dual antiplatelet therapy with aspirin and Plavix for minimum of 1 year.     Juanda Bond. Burt Knack, MD     MDC/MEDQ  D:  02/26/2010  T:  02/27/2010  Job:  [id number]  cc:   Luis Mcmurray, MD,FACC [facility]  Electronically Signed by Sherren Mocha MD on 03/05/2010 04:56:10 AM

## 2010-03-07 NOTE — Letter (Signed)
Summary: Internal Correspondence/ FAXED [facility]  Internal Correspondence/ FAXED [facility] ORDER   Imported By: Bartholomew Boards 02/26/2010 11:53:57  _____________________________________________________________________  External Attachment:    Type:   Image     Comment:   External Document

## 2010-03-07 NOTE — Letter (Signed)
Summary: External Correspondence/ [facility]  External Correspondence/ [facility]   Imported By: Bartholomew Boards 02/19/2010 16:35:31  _____________________________________________________________________  External Attachment:    Type:   Image     Comment:   External Document

## 2010-03-07 NOTE — Letter (Signed)
Summary: Appointment- Rescheduled  [facility] [place]. [address], [place] [address]   Phone: [phone]  Fax: [phone]     February 06, 2010 MRN: [id number]     [facility] [place], [place]  [address]     Dear Mr. Marts,   Due to a change in our office schedule, your appointment on March 12, 2010 at  2:15 PM must be changed.    Your new appointment will be March 28, 2010 at 9:45 AM.   We look forward to participating in your health care needs.      Sincerely,  Public relations account executive

## 2010-03-07 NOTE — Assessment & Plan Note (Signed)
Summary: abnormal stress test  --agh  Medications Added FISH OIL 1000 MG CAPS (OMEGA-3 FATTY ACIDS) Take 1 tablet by mouth two times a day ASPIRIN 325 MG TABS (ASPIRIN) Take 1 tablet by mouth once a day ZANTAC 150 MG TABS (RANITIDINE HCL) Take 1 tablet by mouth once a day KLS ALLER-TEC 10 MG TABS (CETIRIZINE HCL) Take 1 tablet by mouth once a day B COMPLEX  TABS (B COMPLEX VITAMINS) Take 1 tablet by mouth once a day VITAMIN C 500 MG TABS (ASCORBIC ACID) Take 1 tablet by mouth once a day VITAMIN B-12 1000 MCG TABS (CYANOCOBALAMIN) Take 1 tablet by mouth once a day VITAMIN E 400 UNIT CAPS (VITAMIN E) Take 1 tablet by mouth once a day VITAMIN D 400 UNIT CAPS (CHOLECALCIFEROL) Take 1 tablet by mouth once a day PROBIOTIC  CAPS (PROBIOTIC PRODUCT) Take 1 tablet by mouth once a day IMDUR 30 MG XR24H-TAB (ISOSORBIDE MONONITRATE) Take 1 tablet by mouth once a day NITROSTAT 0.4 MG SUBL (NITROGLYCERIN) dissolve one tablet under tongue for severe chest pain as needed every 5 minutes, not to exceed 3 in 15 min time frame LASIX 20 MG TABS (FUROSEMIDE) Take 1 tablet by mouth once a day      Allergies Added: NKDA  Visit Type:  Follow-up Primary Provider:  Kern Alberta, MD   History of Present Illness: the patient is a 70 year old male with a history of prior cardiac catheterization in 2003. The patient required a stent placement to right coronary artery lesion. At that time had nonobstructive disease in the LAD and circumflex coronary artery.catheterization was performed by Dr. Lia Foyer at that time. In July of 2010 the patient had a stress echocardiogram the patient was able to exercise for 6 minutes and 7 seconds. There was a hypertensive blood pressure response he was able to achieve 7 metabolic equivalents. Ejection fraction is 55-60% at rest and there was a apical inferior wall segments and was hypokinetic unchanged during exercise. Otherwise a stress study was normal.  the patient had a recent  stress test done ordered by his primary care physician because of increased shortness of breath on exertion. There was a small inferolateral defect which was reversible but there was also transient ischemic dilatation and mild LV systolic dysfunction with RV dilatation. The nuclear perfusion study was not inconsistent with multivessel coronary artery disease secondary to balanced ischemia. Next  The patient indeed reports increased shortness of breath on minimal exertion. He states just walking in his kitchen makes him short of breath. His symptoms became worse over the last 2 weeks. He's also been under increased stress since his father died in 18-Feb-2023. He reports some problems with his memory. He denies any substernal chest pain on exertion, he denies palpitations presyncope or syncope. His dyspnea however is marked and is his cardinal presenting symptom.  Preventive Screening-Counseling & Management  Alcohol-Tobacco     Smoking Status: never  Current Medications (verified): 1)  Probenecid 500 Mg  Tabs (Probenecid) .... Take One Tablet Two Times A Day 2)  Fish Oil 1000 Mg Caps (Omega-3 Fatty Acids) .... Take 1 Tablet By Mouth Two Times A Day 3)  Cozaar 100 Mg Tabs (Losartan Potassium) .... Take 1 Tablet By Mouth Once A Day 4)  Aspirin 325 Mg Tabs (Aspirin) .... Take 1 Tablet By Mouth Once A Day 5)  Lorazepam 1 Mg  Tabs (Lorazepam) .... Take One Tablet As Needed 6)  Zocor 40 Mg  Tabs (Simvastatin) .... Once Daily 7)  Zantac 150 Mg Tabs (Ranitidine Hcl) .... Take 1 Tablet By Mouth Once A Day 8)  Kls Aller-Tec 10 Mg Tabs (Cetirizine Hcl) .... Take 1 Tablet By Mouth Once A Day 9)  B Complex  Tabs (B Complex Vitamins) .... Take 1 Tablet By Mouth Once A Day 10)  Vitamin C 500 Mg Tabs (Ascorbic Acid) .... Take 1 Tablet By Mouth Once A Day 11)  Vitamin B-12 1000 Mcg Tabs (Cyanocobalamin) .... Take 1 Tablet By Mouth Once A Day 12)  Vitamin E 400 Unit Caps (Vitamin E) .... Take 1 Tablet By Mouth Once A  Day 13)  Vitamin D 400 Unit Caps (Cholecalciferol) .... Take 1 Tablet By Mouth Once A Day 14)  Probiotic  Caps (Probiotic Product) .... Take 1 Tablet By Mouth Once A Day 15)  Imdur 30 Mg Xr24h-Tab (Isosorbide Mononitrate) .... Take 1 Tablet By Mouth Once A Day 16)  Nitrostat 0.4 Mg Subl (Nitroglycerin) .... Dissolve One Tablet Under Tongue For Severe Chest Pain As Needed Every 5 Minutes, Not To Exceed 3 in 15 Min Time Frame 17)  Lasix 20 Mg Tabs (Furosemide) .... Take 1 Tablet By Mouth Once A Day  Allergies (verified): No Known Drug Allergies  Comments:  Nurse/Medical Assistant: The patient's medication list and allergies were reviewed with the patient and were updated in the Medication and Allergy Lists.  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) HEMORRHOIDS, EXTERNAL (ICD-455.3) NEOPLASM, BENIGN, COLON (ICD-211.3) * Hx of HOSPITAL/VENTILATOR-ACQUIRED PNEUMONIA * EVACUATION OF HEMATOMA AFTER SPINE SURGERY * C6 SPONDYLOLYSIS AND CERVICAL SPINE SURGERY UMBILICAL HERNIORRHAPHY, HX OF (ICD-V45.89) TONSILLECTOMY, HX OF (ICD-V45.79) Hx of NOCTURIA X3 (ICD-788.43) MEMORY LOSS (ICD-780.93) CARPAL TUNNEL RELEASE, RIGHT, HX OF (ICD-V45.89) HYPERLIPIDEMIA (ICD-272.4) PERCUTANEOUS TRANSLUMINAL CORONARY ANGIOPLASTY, HX OF (ICD-V45.82) CORONARY ARTERY DISEASE (ICD-414.00) Hx of BURSITIS, RIGHT SHOULDER (ICD-726.10) GOUT (ICD-274.9) GASTROESOPHAGEAL REFLUX DISEASE (ICD-530.81) POLYPECTOMY, HX OF (ICD-V15.9) DIVERTICULOSIS, COLON, HX OF (ICD-V12.79) PANCREATITIS, HX OF (ICD-V12.70) ALLERGIC RHINITIS (ICD-477.9) ANXIETY, CHRONIC (ICD-300.00) HYPERTENSION (ICD-401.9) depression  Past Surgical History: Last updated: 04/13/2007 * EVACUATION OF HEMATOMA AFTER SPINE SURGERY * C6 SPONDYLOLYSIS AND CERVICAL SPINE SURGERY UMBILICAL HERNIORRHAPHY, HX OF (ICD-V45.89) TONSILLECTOMY, HX OF (ICD-V45.79) CARPAL  TUNNEL RELEASE, RIGHT, HX OF (ICD-V45.89) PERCUTANEOUS TRANSLUMINAL CORONARY ANGIOPLASTY, HX OF (ICD-V45.82) POLYPECTOMY, HX OF (ICD-V15.9)  Family History: Last updated: 08/24/2007 Family History of Clotting disorder: Father Family History of Diabetes: Mother Family History of Heart Disease: Mother  Social History: Last updated: 08/24/2007 Occupation: Disabled Patient has never smoked.  Alcohol Use - no Daily Caffeine Use - 2 cups coffee Illicit Drug Use - no  Risk Factors: Smoking Status: never (02/19/2010)  Review of Systems       The patient complains of fatigue, shortness of breath, and anxiety.  The patient denies malaise, fever, weight gain/loss, vision loss, decreased hearing, hoarseness, chest pain, palpitations, prolonged cough, wheezing, sleep apnea, coughing up blood, abdominal pain, blood in stool, nausea, vomiting, diarrhea, heartburn, incontinence, blood in urine, muscle weakness, joint pain, leg swelling, rash, skin lesions, headache, fainting, dizziness, depression, enlarged lymph nodes, easy bruising or bleeding, and environmental allergies.    Vital Signs:  Patient profile:   70 year old male Height:      68 inches Weight:      259 pounds BMI:     39.52 O2 Sat:      95 % on Room air Pulse rate:   80 / minute BP sitting:   127 / 80  (left arm)  Cuff size:   large  Vitals Entered By: Georgina Peer (February 19, 2010 9:54 AM)  Nutrition Counseling: Patient's BMI is greater than 25 and therefore counseled on weight management options.  O2 Flow:  Room air  Physical Exam  Additional Exam:  General: Well-developed, well-nourished in no distress head: Normocephalic and atraumatic eyes PERRLA/EOMI intact, conjunctiva and lids normal nose: No deformity or lesions mouth normal dentition, normal posterior pharynx neck: Supple, no JVD.  No masses, thyromegaly or abnormal cervical nodes lungs: Normal breath sounds bilaterally without wheezing.  Normal  percussion heart: regular rate and rhythm with normal S1 and S2, no S3 or S4.  PMI is normal.  No pathological murmurs abdomen: Normal bowel sounds, abdomen is soft and nontender without masses, organomegaly or hernias noted.  No hepatosplenomegaly musculoskeletal: Back normal, normal gait muscle strength and tone normal pulsus: Pulse is normal in all 4 extremities Extremities: mild peripheral pitting edema neurologic: Alert and oriented x 3 skin: Intact without lesions or rashes cervical nodes: No significant adenopathy psychologic: Normal affect    Impression & Recommendations:  Problem # 1:  NONSPECIFIC ABNORMAL UNSPEC CV FUNCTION STUDY (ICD-794.30) the patient has significant dyspnea, a prior history of coronary artery disease with stent placement. His Cardiolite study currently could be suggestive of multivessel coronary artery disease. We will refer to patient for diagnostic cardiac catheterization. We discussed risks and benefits of the procedure with the patient and he is willing to proceed. Apparently Dr. Hyman Hopes will be doing this procedure also described catheterization in 2003. In the interim I placed the patient on Imdur 30 milligrams p.o. q. day, I also started him on Lasix 20 milligrams p.o. q. day as he has some evidence of congestion with lower extremity edema. I also given a prescription for p.r.n. nitroglycerin and patient was instructed that if he has resting symptoms in particular substernal chest pain he needs immediately present to the emergency room. Orders: Cardiac Catheterization (Cardiac Cath) T-Chest x-ray, 2 views ([id number]) T-Basic Metabolic Panel (99991111) T-CBC No Diff [id number]) T-Protime, Auto [id number]) T-PTT [id number])  Problem # 2:  SHORTNESS OF BREATH (ICD-786.05) possibly ischemic equivalent. See details abnormal Cardiolite study above. We will also do a right-sided heart catheterization to make sure the patient is a pulmonary hypertension. He  may need in the future evaluation for obstructive sleep apnea also. His updated medication list for this problem includes:    Cozaar 100 Mg Tabs (Losartan potassium) .Marland Kitchen... Take 1 tablet by mouth once a day    Aspirin 325 Mg Tabs (Aspirin) .Marland Kitchen... Take 1 tablet by mouth once a day    Lasix 20 Mg Tabs (Furosemide) .Marland Kitchen... Take 1 tablet by mouth once a day  Orders: Cardiac Catheterization (Cardiac Cath) T-Chest x-ray, 2 views ([id number]) T-Basic Metabolic Panel (99991111) T-CBC No Diff [id number]) T-Protime, Auto [id number]) T-PTT [id number])  Problem # 3:  MEMORY LOSS (ICD-780.93) memory loss could be secondary to some anxiety. I doubt this is related to coronary disease but the patient is trying to tell me that the same thing happened in 2003 prior to his cardiac catheterization intervention.  Patient Instructions: 1)  Left & Right JV Cath  2)  Indur 30mg  every morning 3)  Nitroglycerin as needed for severe chest pain only  4)  Lasix 20mg  dailyl 5)  Follow up 1 month Prescriptions: LASIX 20 MG TABS (FUROSEMIDE) Take 1 tablet by mouth once a day  #30 x 6   Entered by:   Lovina Reach, LPN   Authorized by:  Terald Sleeper, MD, [facility]   Signed by:   Lovina Reach, LPN on [id number]   Method used:   Electronically to        [facility] (retail)       [address]       [place], [place]  [address]       Ph: [phone]       Fax: [phone]   [id number]   Handout requested. NITROSTAT 0.4 MG SUBL (NITROGLYCERIN) dissolve one tablet under tongue for severe chest pain as needed every 5 minutes, not to exceed 3 in 15 min time frame  #25 x 3   Entered by:   Lovina Reach, LPN   Authorized by:   Terald Sleeper, MD, [facility]   Signed by:   Lovina Reach, LPN on [id number]   Method used:   Electronically to        [facility] (retail)       [address]       [place], [place]  [address]       Ph: [phone]       Fax: [phone]   [id number] IMDUR 30 MG XR24H-TAB (ISOSORBIDE MONONITRATE) Take 1 tablet by mouth once a day  #30 x 6   Entered by:   Lovina Reach, LPN   Authorized by:   Terald Sleeper, MD, [facility]   Signed by:   Lovina Reach, LPN on [id number]   Method used:   Electronically to        [facility] (retail)       [address]       [place], [place]  [address]       Ph: [phone]       Fax: [phone]   [phone]   Handout requested.

## 2010-03-07 NOTE — Letter (Signed)
Summary: Cardiac Cath Instructions - JV Lab  [facility] [address]   [place], [place] [address]   Phone: [phone]  Fax: [phone]     02/19/2010 MRN: [id number]  [facility] [place],   [id number]  Dear Luis Wolfe,   You are scheduled for a Cardiac Catheterization on Thursday, January 19 at 11:30 with Dr. Lia Foyer.  Please arrive to the 1st floor of the Heart and Vascular Center at [facility] at 10:30 am on the day of your procedure. Please do not arrive before 6:30 a.m. Call the Heart and Vascular Center at [phone] if you are unable to make your appointmnet. The Code to get into the parking garage under the building is [place]. Take the elevators to the 1st floor. You must have someone to drive you home. Someone must be with you for the first 24 hours after you arrive home. Please wear clothes that are easy to get on and off and wear slip-on shoes. Do not eat or drink after midnight except water with your medications that morning. Bring all your medications and current insurance cards with you.  _X__ DO NOT take these medications before your procedure:  Lasix  _X_ Make sure you take your aspirin.  ___ You may take ALL of your medications with water that morning.  ___ DO NOT take ANY medications before your procedure.  ___ Pre-med instructions:  ________________________________________________________________________  The usual length of stay after your procedure is 2 to 3 hours. This can vary.  If you have any questions, please call the office at the number listed above.  Lovina Reach, LPN                  Directions to the [facility]  Please Note : Park in [place] under the building not the parking deck.  From [employer]: Turn onto [address] Left onto [address] (1st stoplight) Right at the brick entrance to the hospital (Main circle  drive) Bear to the right and you will see a blue sign "Heart and Vascular Center" Parking garage is a sharp right'to get through the gate out in the code _______. Once you park, take the elevator to the first floor. Please do not arrive before 0630am. The building will be dark before that time.   From [address] onto [address] left into the brick entrance to the hospital (Main circle drive) [place] to the right and you will see a blue sign "[facility]" Parking garage is a sharp right, to get thru the gate put in the code ____. Once you park, take the elevator to the first floor. Please do not arrive before 0630am. The building will be dark before that time

## 2010-03-07 NOTE — Progress Notes (Signed)
Summary: SOB   Phone Note Call from Patient   Summary of Call: Been on a trip last few days, noticing some SOB.  Advised him to contact PMD Quillian Quince) as Dr. Dannielle Burn is out of the office till 1/5.  If PMD feels that pt needs to be seen sooner than his 1 year f/u time of 2/7, he should have Dr. Quillian Quince call office to request.  Patient verbalized understanding.  Initial call taken by: Lovina Reach, LPN,  January  3, [id number] 10:15 AM

## 2010-03-13 NOTE — Letter (Signed)
Summary: Discharge Summary  Discharge Summary   Imported By: Cher Nakai 03/04/2010 09:57:41  _____________________________________________________________________  External Attachment:    Type:   Image     Comment:   External Document

## 2010-03-13 NOTE — Progress Notes (Signed)
Summary: med question fronm [facility] discharge    Phone Note Other Incoming   Caller: wife - Development worker, [employer] of [employer]: States that when husband was d/c from [facility] - was given rx for Metoprolol.  Wife states that he has "allergy" to this med, caused him to have skipped beats.  States they told Dr. Burt Knack about this, but was still sent home with rx.  Does have eph scheduled for 2/14.  D/C summary scanned in dated 1/24.   Initial call taken by: Lovina Reach, LPN,  January 30, [id number] 9:41 AM  Follow-up for Phone Call        he can hold metoprolol until next clinic visit and we can discuss further Follow-up by: Terald Sleeper, MD, [facility],  March 07, 2010 7:46 AM  Additional Follow-up for Phone Call Additional follow up Details #1::        Patient notified via answering machine.  Additional Follow-up by: Lovina Reach, LPN,  February  2, [id number] 1:43 PM

## 2010-03-18 ENCOUNTER — Encounter: Payer: Self-pay | Admitting: Cardiology

## 2010-03-19 ENCOUNTER — Encounter (INDEPENDENT_AMBULATORY_CARE_PROVIDER_SITE_OTHER): Payer: Medicare Other | Admitting: Cardiology

## 2010-03-19 ENCOUNTER — Encounter: Payer: Self-pay | Admitting: Cardiology

## 2010-03-19 DIAGNOSIS — R000 Tachycardia, unspecified: Secondary | ICD-10-CM

## 2010-03-19 DIAGNOSIS — I2510 Atherosclerotic heart disease of native coronary artery without angina pectoris: Secondary | ICD-10-CM

## 2010-03-19 DIAGNOSIS — I259 Chronic ischemic heart disease, unspecified: Secondary | ICD-10-CM | POA: Insufficient documentation

## 2010-03-27 ENCOUNTER — Ambulatory Visit: Payer: Self-pay | Admitting: Cardiology

## 2010-03-27 NOTE — Miscellaneous (Signed)
Summary: Rehab Report/  FAXED CARDIAC REHAB REFERRAL  Rehab Report/  FAXED CARDIAC REHAB REFERRAL   Imported By: Bartholomew Boards 03/20/2010 16:46:49  _____________________________________________________________________  External Attachment:    Type:   Image     Comment:   External Document

## 2010-03-28 ENCOUNTER — Encounter: Payer: Self-pay | Admitting: Cardiology

## 2010-04-02 NOTE — Miscellaneous (Signed)
Summary: Rehab Report/ CARDIAC REHAB PROGRESS NOTE  Rehab Report/ CARDIAC REHAB PROGRESS NOTE   Imported By: Bartholomew Boards 03/29/2010 10:42:38  _____________________________________________________________________  External Attachment:    Type:   Image     Comment:   External Document

## 2010-04-11 NOTE — Assessment & Plan Note (Signed)
Summary: eph-stint placement with cone -srs  Medications Added ASPIR-LOW 81 MG TBEC (ASPIRIN) Take 1 tablet by mouth once a day VITAMIN B-12 2500 MCG SUBL (CYANOCOBALAMIN) Take 1 tablet by mouth once a day VITAMIN D 2000 UNIT TABS (CHOLECALCIFEROL) Take 1 tablet by mouth once a day LASIX 20 MG TABS (FUROSEMIDE) take one tablet every other day PLAVIX 75 MG TABS (CLOPIDOGREL BISULFATE) Take 1 tablet by mouth once a day MELATONIN 3 MG TABS (MELATONIN) Take 1 tablet by mouth once a day      Allergies Added: NKDA  Primary Provider:  Kern Alberta, MD   History of Present Illness: The patient is doing well after stent placement.  He received two drug alluding stents the right coronary artery.  This taking Plavix.  He has no bleeding complications as instructed about the importance of Plavix for at least a year after his stent placement.  The patient actually needs Plavix indefinitely unless any contraindications.  He still reports of shortness of breath but relates this to being overweight.  He is planning to lose weight and has started cardiac rehab program.  He denies any recurrent chest pain.  He has no other cardiovascular or complications. The patient in the office today has a resting tachycardia of approximately 105 beats/min. he reports no palpitations or dizziness with this.  His rhythm appears regular but we will obtain electrocardiogram.  Preventive Screening-Counseling & Management  Alcohol-Tobacco     Smoking Status: never  Current Medications (verified): 1)  Probenecid 500 Mg  Tabs (Probenecid) .... Take One Tablet Two Times A Day 2)  Fish Oil 1000 Mg Caps (Omega-3 Fatty Acids) .... Take 1 Tablet By Mouth Two Times A Day 3)  Cozaar 100 Mg Tabs (Losartan Potassium) .... Take 1 Tablet By Mouth Once A Day 4)  Aspir-Low 81 Mg Tbec (Aspirin) .... Take 1 Tablet By Mouth Once A Day 5)  Lorazepam 1 Mg  Tabs (Lorazepam) .... Take One Tablet As Needed 6)  Zocor 40 Mg  Tabs (Simvastatin)  .... Once Daily 7)  Zantac 150 Mg Tabs (Ranitidine Hcl) .... Take 1 Tablet By Mouth Once A Day 8)  Kls Aller-Tec 10 Mg Tabs (Cetirizine Hcl) .... Take 1 Tablet By Mouth Once A Day 9)  B Complex  Tabs (B Complex Vitamins) .... Take 1 Tablet By Mouth Once A Day 10)  Vitamin C 500 Mg Tabs (Ascorbic Acid) .... Take 1 Tablet By Mouth Once A Day 11)  Vitamin B-12 2500 Mcg Subl (Cyanocobalamin) .... Take 1 Tablet By Mouth Once A Day 12)  Vitamin E 400 Unit Caps (Vitamin E) .... Take 1 Tablet By Mouth Once A Day 13)  Vitamin D 2000 Unit Tabs (Cholecalciferol) .... Take 1 Tablet By Mouth Once A Day 14)  Probiotic  Caps (Probiotic Product) .... Take 1 Tablet By Mouth Once A Day 15)  Imdur 30 Mg Xr24h-Tab (Isosorbide Mononitrate) .... Take 1 Tablet By Mouth Once A Day 16)  Nitrostat 0.4 Mg Subl (Nitroglycerin) .... Dissolve One Tablet Under Tongue For Severe Chest Pain As Needed Every 5 Minutes, Not To Exceed 3 in 15 Min Time Frame 17)  Lasix 20 Mg Tabs (Furosemide) .... Take One Tablet Every Other Day 18)  Plavix 75 Mg Tabs (Clopidogrel Bisulfate) .... Take 1 Tablet By Mouth Once A Day 19)  Melatonin 3 Mg Tabs (Melatonin) .... Take 1 Tablet By Mouth Once A Day  Allergies (verified): No Known Drug Allergies  Comments:  Nurse/Medical Assistant: The patient's medication  list and allergies were reviewed with the patient and were updated in the Medication and Allergy Lists.  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) HEMORRHOIDS, EXTERNAL (ICD-455.3) NEOPLASM, BENIGN, COLON (ICD-211.3) * Hx of HOSPITAL/VENTILATOR-ACQUIRED PNEUMONIA * EVACUATION OF HEMATOMA AFTER SPINE SURGERY * C6 SPONDYLOLYSIS AND CERVICAL SPINE SURGERY UMBILICAL HERNIORRHAPHY, HX OF (ICD-V45.89) TONSILLECTOMY, HX OF (ICD-V45.79) Hx of NOCTURIA X3 (ICD-788.43) MEMORY LOSS (ICD-780.93) CARPAL TUNNEL RELEASE, RIGHT, HX OF  (ICD-V45.89) HYPERLIPIDEMIA (ICD-272.4) PERCUTANEOUS TRANSLUMINAL CORONARY ANGIOPLASTY, HX OF (ICD-V45.82) CORONARY ARTERY DISEASE (ICD-414.00) Hx of BURSITIS, RIGHT SHOULDER (ICD-726.10) GOUT (ICD-274.9) GASTROESOPHAGEAL REFLUX DISEASE (ICD-530.81) POLYPECTOMY, HX OF (ICD-V15.9) DIVERTICULOSIS, COLON, HX OF (ICD-V12.79) PANCREATITIS, HX OF (ICD-V12.70) ALLERGIC RHINITIS (ICD-477.9) ANXIETY, CHRONIC (ICD-300.00) HYPERTENSION (ICD-401.9) depression  Review of Systems  The patient denies fatigue, malaise, fever, weight gain/loss, vision loss, decreased hearing, hoarseness, chest pain, palpitations, shortness of breath, prolonged cough, wheezing, sleep apnea, coughing up blood, abdominal pain, blood in stool, nausea, vomiting, diarrhea, heartburn, incontinence, blood in urine, muscle weakness, joint pain, leg swelling, rash, skin lesions, headache, fainting, dizziness, depression, anxiety, enlarged lymph nodes, easy bruising or bleeding, and environmental allergies.    Vital Signs:  Patient profile:   70 year old male Height:      68 inches Weight:      257 pounds Pulse rate:   105 / minute Pulse (ortho):   105 / minute BP sitting:   103 / 71  (left arm) BP standing:   90 / 52 Cuff size:   large  Vitals Entered By: Georgina Peer (March 19, 2010 8:32 AM)  Serial Vital Signs/Assessments:  Time      Position  BP       Pulse  Resp  Temp     By 9:22 AM   Lying RA  121/77   99                    Gayle [address] LPN [id number] AM   Sitting   107/71   105                   Gayle [address], LPN [id number] AM   Standing  90/52    105                   [place], LPN  Comments: [id number] AM AFTER 3 MIN. 112/77 HR 112, AFTER 5 MIN. 108/72 HR 109 By: Lovina Reach, LPN    Physical Exam  Additional Exam:  General: Well-developed, well-nourished in no distress head: Normocephalic and atraumatic eyes PERRLA/EOMI intact, conjunctiva and lids normal nose: No deformity or lesions mouth normal dentition, normal  posterior pharynx neck: Supple, no JVD.  No masses, thyromegaly or abnormal cervical nodes lungs: Normal breath sounds bilaterally without wheezing.  Normal percussion heart: regular rate and rhythm with normal S1 and S2, no S3 or S4.  PMI is normal.  No pathological murmurs abdomen: Normal bowel sounds, abdomen is soft and nontender without masses, organomegaly or hernias noted.  No hepatosplenomegaly musculoskeletal: Back normal, normal gait muscle strength and tone normal pulsus: Pulse is normal in all 4 extremities Extremities: mild peripheral pitting edema neurologic: Alert and oriented x 3 skin: Intact without lesions or rashes cervical nodes: No significant adenopathy psychologic: Normal affect    Impression & Recommendations:  Problem # 1:  CORONARY ARTERY DISEASE, S/P PTCA (ICD-414.9) the patient underwent drug-eluting stent placement the proximal as well as mid right coronary artery.  He is doing well.  He reports no chest pain.  His ejection fraction is 65%.  His updated medication list for this problem includes:    Aspir-low 81 Mg Tbec (Aspirin) .Marland Kitchen... Take 1 tablet by mouth once a day    Imdur 30 Mg Xr24h-tab (Isosorbide mononitrate) .Marland Kitchen... Take 1 tablet by mouth once a day    Nitrostat 0.4 Mg Subl (Nitroglycerin) .Marland Kitchen... Dissolve one tablet under tongue for severe chest pain as needed every 5 minutes, not to exceed 3 in 15 min time frame    Plavix 75 Mg Tabs (Clopidogrel bisulfate) .Marland Kitchen... Take 1 tablet by mouth once a day  Problem # 2:  CAD (ICD-414.00) prior history of coronary artery disease  with stenting to the right coronary 2003. His updated medication list for this problem includes:    Aspir-low 81 Mg Tbec (Aspirin) .Marland Kitchen... Take 1 tablet by mouth once a day    Imdur 30 Mg Xr24h-tab (Isosorbide mononitrate) .Marland Kitchen... Take 1 tablet by mouth once a day    Nitrostat 0.4 Mg Subl (Nitroglycerin) .Marland Kitchen... Dissolve one tablet under tongue for severe chest pain as needed every 5 minutes,  not to exceed 3 in 15 min time frame    Plavix 75 Mg Tabs (Clopidogrel bisulfate) .Marland Kitchen... Take 1 tablet by mouth once a day  Problem # 3:  OVERWEIGHT/OBESITY (ICD-278.02) I discussed weight reduction with the patient.  Problem # 4:  HYPERTENSION (ICD-401.9) patient's blood pressure is under good control.  Continue current medical therapy. His updated medication list for this problem includes:    Cozaar 100 Mg Tabs (Losartan potassium) .Marland Kitchen... Take 1 tablet by mouth once a day    Aspir-low 81 Mg Tbec (Aspirin) .Marland Kitchen... Take 1 tablet by mouth once a day    Lasix 20 Mg Tabs (Furosemide) .Marland Kitchen... Take one tablet every other day  Patient Instructions: 1)  Hold Lasix x one week, then resume at one tablet every other day 2)  Follow up in  6 months

## 2010-04-17 ENCOUNTER — Encounter: Payer: Self-pay | Admitting: Cardiology

## 2010-06-18 NOTE — Assessment & Plan Note (Signed)
[facility]                          [facility] OFFICE NOTE   NAME:Griffie, QUEEN WEYMAN                       MRN:          [id number]  DATE:08/03/2008                            DOB:          Sep 12, 1940    REFERRING PHYSICIAN:  Gar Ponto   HISTORY OF PRESENT ILLNESS:  The patient is a 70 year old male with a  history of prior cardiac catheterization in 2003, when he was found to  have a 99% RCA lesion that required stenting.  The patient did have some  nonobstructive coronary artery disease with 50% lesion in the LAD and  circumflex.  The patient states that from a cardiovascular standpoint,  actually he is stable and reports no chest pain.  Unfortunately, he has  had difficulty losing weight.  He also complains of significant anxiety  and depression and this is confirmed by his wife.  He has ruminating  thoughts at night and he takes trazodone for a sleep.  His wife was  actually very vocal and stated today I feel that he should be on Prozac  feels like me.  The patient also complains of chronic fatigue.  He  denies, however, any palpitations or syncope.   MEDICATIONS:  1. Probenecid/colchicine 500 mg/0.5 mg p.o. b.i.d.  2. Trazodone 100 mg p.o. nightly.  3. Plavix 75 mg p.o. daily.  4. Zocor 40 mg p.o. nightly.  5. Cozaar 100 mg p.o. daily.  6. Zetia 10 mg p.o. daily.  7. Omeprazole 20 mg p.o. b.i.d.  8. Aspirin 325 mg p.o. daily.  9. Fish oil 1200 mg p.o. b.i.d.  10.Vitamin C 500 mg p.o. daily.  11.Vitamin D3 2000 units daily.  12.B complex.  13.Calcium.  14.Melatonin daily.  15.Vitamin D 1000 units daily.   PHYSICAL EXAMINATION:  VITAL SIGNS:  Blood pressure 132/82, heart rate  72, weight 237 pounds.  GENERAL:  Well-nourished, white male, in no apparent distress.  HEENT:  Pupils, eyes clear; conjunctivae clear.  NECK:  Supple.  Normal carotid upstroke.  No carotid bruits.  LUNGS:  Clear breath sounds bilaterally.  HEART:  Regular  rate and rhythm with normal S1 and S2.  No murmurs,  rubs, or gallops.  ABDOMEN:  Soft, nontender.  No rebound or guarding.  Good bowel sounds.  EXTREMITIES:  No cyanosis, clubbing, or edema.  NEURO:  The patient is alert, oriented, and grossly nonfocal.   PROBLEM LIST:  1. Coronary artery disease, status post stenting of the right coronary      artery in 2003.  2. Obesity.  3. Hypertension.  4. Anxiety and depression.   PLAN:  1. The patient is due for a functional study given the fact that his      procedure was done in 2003.  We have decided to redo a stress echo      as this was the patient's preference.  If the echo is negative, I      also do not think the patient needs to start Plavix at this point      in time and we have also asked him to cut  back his aspirin to 81 mg      p.o. daily.  2. The patient clearly has a anxiety disorder with irritability and in      addition depression.  I have asked him to discuss with Dr. Quillian Quince,      but the wife has very much in favor that we should start as soon as      possible with medical therapy.  I do think it is indicated, and I      have given the patient prescription for citalopram 20 mg p.o.      daily.  I have asked the patient, however, to follow up closely      with Dr. Quillian Quince on this issue and he can further modify the therapy      according to his judgment.     Ernestine Mcmurray, MD,FACC  Electronically Signed    GED/MedQ  DD: 08/03/2008  DT: 08/04/2008  Job #: [id number]   cc:   Gar Ponto

## 2010-06-21 NOTE — Op Note (Signed)
NAMEESMAEL, ZAPIEN NO.:  [id number]   MEDICAL RECORD NO.:  [id number]          PATIENT TYPE:  OIB   LOCATION:  2109                         FACILITY:  [facility]   PHYSICIAN:  Mark C. [name], M.D.    DATE OF BIRTH:  [birth year]   DATE OF PROCEDURE:  07/22/2005  DATE OF DISCHARGE:                                 OPERATIVE REPORT   PREOPERATIVE DIAGNOSES:  1.  Recurrence of hematoma.  2.  Postoperative cervical fusion with fibular stark graft and plating.  3.  Respiratory distress.   POSTOPERATIVE DIAGNOSES:  1.  Recurrence of hematoma.  2.  Postoperative cervical fusion with fibular stark graft and plating.  3.  Respiratory distress.   PROCEDURE:  Re-exploration second time anterior cervical wound for  evacuation of hematoma.   SURGEON:  Mark C. [name], M.D.   ASSISTANT:  Jessy Oto, M.D.   ANESTHESIA:  General nasotracheal.   EBL:  100 mL.   EVACUATION:  100 mL old clot.   This 70 year old male had expanded hematoma in his neck with problems with  repetitive coughing, in clearing his throat was coughing some blood from the  difficult intubation for a second procedure.  He kept coughing blood and  then had increasing swelling of his neck with repetitive coughing he was  doing.  He was taken to the operating room and was finally intubated using  the bronchoscope using nasotracheal tube with visualization and intubated  over this by Dr. Chriss Driver.  It was noted there was a significant amount of  bleeding present in the trachea around the larynx and blood was noted down  in the bronchus.  This was suctioned out.  Once he was intubated, standard  prepping was performed with Betadine scrub, Betadine solution, area  __________ with towels, Betadine and Vi-Drape applied, a sterile Mayo stand  at the head and thyroid __________.  Wound was spread, subcuticular Vicryl  was cut, the Hemovac drain had been removed and the Hemovac, despite the  expanding neck  hematoma, only put out 25 mL over 5 hours.  The Vicryl simple  sutures and the platysma were released and clots were evacuated out.  The  plate was in good position, screws were visualized, the graft was in good  position.  There was no evidence of blood coming up around the graft.  The  patient was neurologically intact and had good relief of his preop pain and  numbness, had good strength moving all extremities prior to this procedure  and this has been maintained since his original surgery on July 21, 2005.  Copious irrigation was used, top, middle, bottom portion of the plate was  visualized.  Superior thyroid vein and artery were visualized, there did not  appear to be any bleeding, it was carefully inspected, clots were suctioned  from around it.  The patient's pressure was down some and since there was a  concern that the patient had reaccumulated hematoma suture ligatures were  placed using #3-0 silk with double ligature using a pop-off needle on both  sides.  There did not  appear to be injury to the artery or vein that was  tied off and it was tied off precautionary.  Headlight and loop  magnification was used.  Once all clots were evacuated, the sponge was  placed, waited several minutes, the sponge was removed.  There was minimal  bleeding present on the sponge.  Wound was then inspected carefully from top  to bottom sequentially medial as well as lateral, proximal and distal and  there was no sources of bleeding present.  A 1/2-inch Penrose was then  placed in the wound, two interrupted Vicryl, platysma sutures were placed,  neck swelling was significantly down with removal of clots and five skin  staples were used, loosely approximating so that if there was any bleeding  passively blood could get out.  A dressing was applied and the patient will  be left intubated for 48 hours, followed by the intensivist service and kept  paralyzed and intubated so that he can be suctioned and  have the blood  removed from his trachea as needed without coughing and having re-expansion  of the hematoma postoperatively in his neck.  Patient was transferred to the  recovery room in stable condition independent.      Mark C. [name], M.D.  Electronically Signed     MCY/MEDQ  D:  07/22/2005  T:  07/22/2005  Job:  [id number]

## 2010-06-21 NOTE — Discharge Summary (Signed)
NAMEWHITNEY, REDDINGER                ACCOUNT NO.:  [id number]   MEDICAL RECORD NO.:  [id number]          PATIENT TYPE:  INP   LOCATION:  [id number]                         FACILITY:  [facility]   PHYSICIAN:  Gwendolyn Grant, M.D. LHCDATE OF BIRTH:  Oct 22, 1940   DATE OF ADMISSION:  08/26/2004  DATE OF DISCHARGE:  08/29/2004                                 DISCHARGE SUMMARY   DISCHARGE DIAGNOSIS:  Status post small bowel obstruction.   HISTORY OF PRESENT ILLNESS:  The patient is a 70 year old male followed by  Dr. Linda Hedges.  The patient was admitted with complaints of mid abdominal pain  described as a grabbing intermittent pain that was severe and began on August 26, 2004.  The patient located discomfort above the umbilicus and did not  receive any relief with home remedies.  The patient presented to the  emergency room  for evaluation and further workup.   PAST MEDICAL HISTORY:  1.  Dyslipidemia.  2.  Coronary artery disease status post PTCA.  3.  Hypertension.   HOSPITAL COURSE:  Problem 1:  Small bowel obstruction.  The patient was admitted and underwent  a KUB which indicated small bowel obstruction.  In addition, the patient  underwent a CT of the abdomen and pelvis which indicated a mid small bowel  obstruction likely secondary to adhesions.  As a result, general surgical  consult was requested.  A follow up KUB was performed on July 26 which  showed some improvement in small bowel obstruction.  Diet was advanced by  the surgical team.  At the time of discharge, the patient currently has no  abdominal complaints, no nausea, no vomiting, positive flatus, ambulating in  the halls.   Problem 2:  Hypertension.  Blood pressure remained stable during  hospitalization, planned to continue on home medications.   Problem 3:  Mild hypokalemia.  The patient was noted to have potassium 3.4,  will give small dose of K-Dur prior to discharge.   Problem 4:  History of coronary artery disease.  The  patient had no cardiac  complaints, continue with medical management.   LABORATORY DATA AT DISCHARGE:  BUN 10, creatinine 1.1, potassium 3.4.  Hemoglobin 13, hematocrit 37.6, platelets 255, white blood cell count 6.9.   DISCHARGE MEDICATIONS:  1.  Probenecid 500 mg p.o. b.i.d.  2.  Nexium 40 mg p.o. daily.  3.  Pravachol 80 mg p.o. daily.  4.  HCTZ 25 mg p.o. daily.  5.  Cozaar 50 mg p.o. daily.  6.  Paxil 20 mg p.o. daily.  7.  Zyrtec 10 mg p.o. daily.  8.  Aspirin 81 mg p.o. daily.   FOLLOW UP:  The patient is to follow up with Dr. Linda Hedges on Tuesday, August  8, at 2:15 p.m.  In addition, the patient is instructed to call Dr. Linda Hedges  and go to the ER should he develop nausea, vomiting, or abdominal pain.       MSO/MEDQ  D:  08/29/2004  T:  08/29/2004  Job:  [id number]   cc:   Heinz Knuckles. Norins, M.D. [facility]

## 2010-06-21 NOTE — Cardiovascular Report (Signed)
   NAME:  Luis Wolfe, Luis Wolfe                          ACCOUNT NO.:  [id number]   MEDICAL RECORD NO.:  [id number]                   PATIENT TYPE:  OIB   LOCATION:  2899                                 FACILITY:  [facility]   PHYSICIAN:  Minus Breeding, M.D. [facility]            DATE OF BIRTH:  08-03-1940   DATE OF PROCEDURE:  DATE OF DISCHARGE:                              CARDIAC CATHETERIZATION   PRIMARY CARE PHYSICIAN:  Heinz Knuckles. Norins, M.D. [facility]   PROCEDURE:  Left heart catheterization/coronary arteriography.   INDICATIONS FOR PROCEDURE:  Evaluate the patient with exertional angina that  has been slowly progressive.  Left heart catheterization is performed via  the right femoral artery.  The artery was cannulated using an anterior wall  puncture.  A #6 French arterial sheath was inserted via modified Seldinger  technique.  Preformed Judkins and a pigtail catheter were utilized.  The  patient tolerated the procedure well and left the lab in stable condition.   RESULTS:  1. Hemodynamics; LV 170/25, AO 170/80.  2. Left main was normal.  3. The LAD had long, mid 50% stenosis tapering to a 70% stenosis after a     large first diagonal.  4. The circumflex and the AV groove had a 50% stenosis after a large mid     obtuse marginal and before a second large marginal.  5. The right coronary artery was a large dominant vessel.  There was mid 99%     stenosis.  There was a long mid 50 to 60% stenosis.  6. Left ventriculogram was obtained in the RAO projection. The EF was 55%     with very mild inferior hypokinesis.   CONCLUSION:  High grade right coronary artery disease.  Moderate obstruction  of the LAD.   PLAN:  The patient will have percutaneous revascularization of the culprit  lesion which is obviously the right coronary. However, we will further  evaluate his LAD, most likely with a stress perfusion study as an outpatient  in the future.  He will get aggressive secondary risk  reduction.                                                Minus Breeding, M.D. [facility]    JH/MEDQ  D:  10/29/2001  T:  11/01/2001  Job:  [id number]   cc:   Heinz Knuckles. Norins, M.D. [facility]

## 2010-06-21 NOTE — Discharge Summary (Signed)
NAMEALEXEY, Luis Wolfe                ACCOUNT NO.:  [id number]   MEDICAL RECORD NO.:  [id number]           PATIENT TYPE:   LOCATION:                                 FACILITY:   PHYSICIAN:  Mark C. [name], M.D.         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   ADDITIONAL DIAGNOSES:  1. Postoperative hematoma.  That was on July 22, 2005, 3 a.m.  Return to      OR for evacuation of postoperative hematoma.  2. Repeat return to OR on July 22, 2005, 10 a.m.  Postoperative hematoma      and findings of pharyngeal laceration with hemoptysis.  That was 10      a.m.  3. Prolonged intubation to protect airway.  Otolaryngology consultation      July 22, 2005, by Dr. [name].  Direct laryngoscopy in operating      room with reintubation on July 25, 2005, and attempt at tube feeding      placement.  Gastrostomy placement on July 25, 2005, by Dr. [name].      CCS intraoperative consultation by Dr. [name].  4. Infectious disease consultation July 27, 2005, for pneumonitis      secondary to prolonged intubation to protect cervical operative site.  5. Repeat direct laryngoscopy on July 31, 2005, with extubation with      microdirect laryngoscopy and rigid esophagoscopy on July 31, 2005.  6. Nutritional consultation for tube feeding.  7. Rehab consultation.   This 70 year old man with progressive cervical spondylosis with stenosis,  shoulder and arm pain, bilateral C7 weakness.  MRI on May 7 showed  progression from November 4 MRI with progressive stenosis, disk bulging,  multilevel biforaminal narrowing and severe central stenosis.  Discussion  was made about the nature of this problem and for decompression  recommendation was C5 corpectomy with allograft and anterior plating.  Risks  of surgery were discussed  including bleeding, infection, nerve damage,  paralysis, dural tear, spinal fluid leak, headaches, fracture,  pseudoarthrosis and reoperation were discussed.  Informed consent was  obtained and patient signed a handwritten note that is in the chart.  Patient underwent the procedure with Dr. [name] as surgeon, Dr. [name] assisting, and minimal bleeding when drain was placed  postoperatively.  Patient was transferred to the floor, was doing well until  early in the evening and I was called to see patient at 1 or 2 a.m. with the  complaint that he felt like he was choking.  Exam at that time showed  expanding hematoma of the neck.  He kept coughing, trying to clear his  throat and was taken back to the operating room attempted awake intubation  was performed with difficulty.  Eventually, the patient was intubated and  neck was opened.  The hematoma was evacuated, but there was no obvious site  for bleeding.  No active  bleeding was found at any place.  Graft looked  good.  Plate looked good.  X-rays showed good position of plate and graft  and patient had another drain placed.  He did well for several hours, then  continued to start coughing, felt like he was choking.  Continued to  Valsalva trying to clear his throat.  Was coughing, having trouble  swallowing, and had reexpansion of his hematoma at that time.  He was taken  back to the operating room and it was very difficult to get the patient  intubated.  He was coughing blood once the tube was placed.  As he was  falling asleep he would cough more blood up, waking every 5 minutes to  resolve his laryngeal edema.  After evacuation of the hematoma, Dr.  ___nitka_______  assisted.  There was no evidence of bleeding.  One of the  thyroid vessels were tied off, but it was not bleeding and it was carefully  inspected on all sides, run its course, and was tied off only because of  potential it might be a source of bleeding, although there  is no damage to  it and it had good flow.  He had a nasal tube placed over fiberoptic and 100  mL of clot was evacuated.  There was blood in the trachea after intubation.  He remained intubated and due to persistent swelling problems, ENT  consultation was obtained.  As listed above, he was extubated in the  operating room and visualization at that point showed that there was a  posterior pharyngeal tear that occurred likely when he was intubated.  This  was causing bleeding down into the lungs.  Patient would cough, repetitive  coughing, was causing epidural bleeding or bleeding in the operative site,  but there was no evidence of perforation either in the esophagus or in the  trachea that communicated with the operative site and operative site  continued to remain dry.  Patient required some transfusions.  He was  followed by the cardiac intensive care and was on supportive drips.  Received transfusion.  There was serosanguineous drainage from the Penrose  that was placed in his neck.  It was slowly advanced.  He was on Ancef  antibiotics and Penrose was pulled from the neck on July 24, 2005.  Chest x-  ray showed underinflation with bibasilar atelectasis.  X-ray showed good  position of hardware and graft.  CTs were obtained, which showed good  position.  Hemoglobin was 9.4 on July 24, 2005.  Head CT showed some chronic  changes but no evidence of stroke or bleed.  Ultimately, patient began to  have some fever.  Infectious disease consultation was obtained.  He  gradually had improvement in the swelling in his neck.  He had positive  staph blood cultures and unfortunately in this case the tube could not be  removed due to the problems he had had with swelling in his throat.  He  would have airway compromise.  Ideally, a tracheostomy would have been  performed and discussions were held in conference with Dr. [name], myself, and the critical care team and anesthesia team.  And in order to  protect the  cervical and epidural space as well as the plate and bone graft from  developing chronic infection, intubation was the only choice, which left him  at risk for pneumonitis problems.  Endoscopy showed purulence around the  tear and gradually with time on antibiotics, patient improved with decreased  swelling, improvement in airway and on repeat endoscopy there is significant  improvement, and ultimately was extubated.  Continued on IV antibiotics for  positive Staph.  Pulmonary CC followed impurity of gram-positive cocci in  chains.  Stat Staph coag negative was present.  Once he was extubated, he  still had some numbness in his right arm and some initial weakness, which  over a few days continued to improve to the point where he was able to reach  his arm up over his head.  Had improvement in his triceps.  Surprisingly,  there were no vocal cord problems, this after 2 weeks of intubation.  He had  some mild hyperglycemia responding to a sliding scale.  He was extubated on  August 05, 2005.  He was confused.  He remained in PACU.  Tube feedings were  resumed and swallowing studies were performed a few times, which showed poor  swallowing mechanism and this required continued tube feedings, and he was  only on a few chips of ice.  Repeat swallowing studies were performed and  arrangements made for transfer to rehab after approval from [facility]  Patient was ambulatory.  Had good x-rays of his neck.  Incision  looked good.  There was no sign of infection.  It was obvious that patient  had a pharyngeal tear at some point with intubation and that this was  causing bleeding, causing patient to continually cough, which was making his  operative site have venous bleeding from the Valsalva.  Condition at  discharge was improved and stable.  There was no evidence of cord  compression or myelopathy.  He was ambulatory and had trace weakness in both  triceps, and had slight  weakness in the right rotator cuff, less than 1/2  grade, but it was improving every few days and was probably related to  postoperative hematoma.      Mark C. [name], M.D.  Electronically Signed     MCY/MEDQ  D:  11/03/2005  T:  11/04/2005  Job:  [id number]   cc:   [name], MD  Alison Murray, M.D.

## 2010-06-21 NOTE — Cardiovascular Report (Signed)
NAME:  Luis Wolfe, Luis Wolfe                          ACCOUNT NO.:  [id number]   MEDICAL RECORD NO.:  [id number]                   PATIENT TYPE:  INP   LOCATION:  3711                                 FACILITY:  [facility]   PHYSICIAN:  Loretha Brasil. Lia Foyer, M.D. [facility]         DATE OF BIRTH:  1940/02/04   DATE OF PROCEDURE:  10/29/2001  DATE OF DISCHARGE:  10/31/2001                              CARDIAC CATHETERIZATION   INDICATIONS:  The patient is a delightful 71 year old gentleman. I had met  him previously, as I have been the care Jayona Mccaig for his mother. He  underwent diagnostic catheterization by Dr. 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 There was also a 50% to 60%  lesion more distally and moderate segmental disease of the mid left anterior  descending artery after the major diagonal takeoff. Dr. Percival Spanish felt that  percutaneous stenting of the right coronary would be the best option, and  plans were made for  this. We discussed the case with the wife and the  patient and the risks were explained to the patient. Preparations  were made  for percutaneous intervention.   PROCEDURE:  Percutaneous stenting of the right coronary artery.   DESCRIPTION OF PROCEDURE:  The patient was brought to the catheter  laboratory and prepped and draped in the usual fashion. He had underwent  diagnostic catheterization by Dr. Percival Spanish. Following this and  following  the decision making process, preparations were made for percutaneous  intervention.  Heparin was given as a bolus. An ACT was checked and found to  be in excess of 200  seconds. In addition glycoprotein inhibitor with  Integrelin was administered.   The right coronary artery was then intubated using a JR-4 with side holes. A  BMW wire was used to cross the lesion. Predilatation was performed using a  2.75 x 10 Cross Sail balloon. Following predilatation, we were given an 18 x  2.5 mm Cordis Cypher stent for  stenting of the mid vessel. This was then  dilated, taken up to about 14 to 15 atmospheres.   Following this, the stent was implanted into the large area proximally, and  the stent was then postdilated using a 3.25 x 12 Quantum Maverick balloon  and this was taken up to about 12 atmospheres, yielding a complete 3.25 mm  opening. We then carefully studied the distal lesion of 50% to 60% which was  segmental and we discussed the possibility of stenting this area as well.   I brought Dr. Percival Spanish into the room as well as Dr. Albertine Patricia and Dr. Lyndel Safe.  The consensus opinion was to defer this at the present time. The patient was  also noted to have a 70% posterolateral stenosis. The patient tolerated the  procedure well.   The wire was  removed and all catheters were subsequently removed and the  femoral sheath sewn into place. He was taken  to the holding area in  satisfactory clinical condition.   ANGIOGRAPHIC DATA:  The right coronary artery had an upward takeoff with  about 30% narrowing just prior to  the first bend in the mid vessel prior to  the right ventricular branch. There is a 95%  to 99% stenosis with TIMI 3  flow. In the distal vessel there is a 50% to 60% area of segmental narrowing  which is slightly more severe at the distal end of the  lesion. This is  prior to  the origin of the  PDA. There is a PDA, first posterolateral and a  second  posterolateral branch. The second posterolateral branch is large and  has about 70% proximal narrowing.   Following the stent procedure the mid stenosis of 95% to 99% was reduced to  0% with an excellent angiographic appearance. There was about 30% to 50%  narrowing of the  ostium of the right ventricular branch,  but good flow and  no reduction in flow through this  vessel. The distal vessel remained  unchanged. As noted, the decision was made to hold off on dilatation of this  area, as it did not appear to be flow limiting.   CONCLUSION:   Successful percutaneous stenting of the right coronary artery.   DISPOSITION:  The patient had a drug eluding Cypher stent. Oral Plavix for  at least three months will be recommended. Aggressive medical management for  risk reduction and exercise tolerance testing to assess ischemia in other  myocardial territories as well as the distal right coronary will be  recommended.                                               Loretha Brasil. Lia Foyer, M.D. [facility]    TDS/MEDQ  D:  10/29/2001  T:  11/02/2001  Job:  [id number]   cc:   Minus Breeding, M.D. [facility]  [address]  [place] [address]  Fax: 1   Heinz Knuckles. Norins, M.D. [facility]   CV Laboratory

## 2010-06-21 NOTE — Op Note (Signed)
Luis Wolfe, Luis NO.:  [id number]   MEDICAL RECORD NO.:  [id number]          PATIENT TYPE:  INP   LOCATION:  2109                         FACILITY:  [facility]   PHYSICIAN:  [name], MD         DATE OF BIRTH:  [birth year]   DATE OF PROCEDURE:  07/25/2005  DATE OF DISCHARGE:                                 OPERATIVE REPORT   PREOPERATIVE DIAGNOSIS:  Obstructing laryngeal edema.   POSTOPERATIVE DIAGNOSIS:  Obstructing laryngeal edema.   PROCEDURE:  Microdirect laryngoscopy.   SURGEON:  [name], M.D.   ANESTHESIA:  General endotracheal anesthesia.   ESTIMATED BLOOD LOSS:  None.   Dictation ended at this point.      [name], MD  Electronically Signed     SJ/MEDQ  D:  07/31/2005  T:  07/31/2005  Job:  [id number]

## 2010-06-21 NOTE — Consult Note (Signed)
[name], DEBOISE                ACCOUNT NO.:  [id number]   MEDICAL RECORD NO.:  [id number]          PATIENT TYPE:  INP   LOCATION:  [id number]                         FACILITY:  [facility]   PHYSICIAN:  Darrelyn Hillock, MDDATE OF BIRTH:  10-11-1940   DATE OF CONSULTATION:  08/28/2004  DATE OF DISCHARGE:                                   CONSULTATION   REASON FOR CONSULTATION:  Small bowel obstruction.   Mr. Luis Wolfe is a 70 year old male patient who was admitted two days ago with  midabdominal pain, abdominal distention, nausea and vomiting.  He describes  the abdominal pain as a cramping sensation.  He presented to the emergency  room and a CT showed a small bowel dilatation just deep to the umbilicus  that was felt to be related to adhesions.  The patient has had an umbilical  hernia repair in the past.  Follow-up abdominal films today showed some  central bowel loops that were dilated, but the contrast did fill into the  rectum.  He is passing flatus now.  He is ambulating in the hall.  He now  complains of abdominal soreness but no nausea and vomiting.  Review of  systems is otherwise negative.  His CMET and lipase were normal.  His white  count was mildly elevated at 11,100.  We were asked to see the patient  regarding surgical treatment.   ALLERGIES:  IBUPROFEN.   MEDICATIONS:  Aspirin, Cozaar, Protonix, Paxil, Probenecid and Zocor.   SOCIAL HISTORY:  No tobacco, alcohol or illicit drug use.  He is married.   PAST MEDICAL HISTORY:  Pancreatitis, coronary artery disease, history of  stent placement in 2003, diverticulosis, colonic polyps status post  polypectomy in 2000, history of hypercholesterolemia, reflux disease,  hypertension, gout and right shoulder bursitis.   FAMILY HISTORY:  Coronary artery disease.   PHYSICAL EXAMINATION:  VITAL SIGNS:  Temperature 98.7, pulse 69,  respirations 20, blood pressure 127/65, O2 saturation 96% on room air.  HEENT:  Grossly normal.   Sclerae are clear.  Conjunctivae are normal.  Ears  without drainage.  She wears glasses.  NECK:  Normal carotid or subclavian bruits.  No JVD or thyromegaly.  CHEST:  Clear to auscultation bilaterally.  No wheezing or rhonchi.  CARDIAC:  Regular rate and rhythm, no __________, murmur, rub or ectopy.  ABDOMEN:  Soft.  The periumbilical area is tender.  There are active bowel  sounds.  There is no rigidity.  NEUROLOGIC:  Grossly intact.  SKIN:  Warm and dry.   DIAGNOSIS:  Abdominal pain.   PLAN:  Abdominal pain:  Probable small bowel obstruction with resolution.  The probable cause of the small bowel obstruction was secondary to adhesions  from the history of umbilical hernia repair.  At this point he is passing  flatus and the pain has subsided, he has no nausea or vomiting.  We  recommend advancing his diet, and we will follow  him in the morning although at this time it is not felt that he will need  surgery.  At this point the patient may eat,  ambulate, and we will follow  along with you.   Thank you for the consult.       LB/MEDQ  D:  08/28/2004  T:  08/28/2004  Job:  [id number]   cc:   Heinz Knuckles. Norins, M.D. [facility]   Darrelyn Hillock, MD  [address] [place]  [place] [address]

## 2010-06-21 NOTE — Op Note (Signed)
NAME:  NEEDHAM, MACGOWAN                ACCOUNT NO.:  [id number]   MEDICAL RECORD NO.:  [id number]          PATIENT TYPE:  OIB   LOCATION:  2550                         FACILITY:  [facility]   PHYSICIAN:  Mark C. [name], M.D.    DATE OF BIRTH:  27-Jan-1941   DATE OF PROCEDURE:  07/21/2005  DATE OF DISCHARGE:                                 OPERATIVE REPORT   PREOPERATIVE DIAGNOSIS:  Cervical spondylosis with central cervical  stenosis.   PREOPERATIVE DIAGNOSIS:  Cervical spondylosis with central cervical  stenosis.   PROCEDURE:  C5 corpectomy; C3-4, C4-5, C5-6 anterior cervical diskectomy and  fusion; fibular allograft C4 to C6, 6 mm graft C3-4; anterior plating; and  microdiskectomy.   PROCEDURE:  After induction of general anesthesia and preoperative Ancef,  the patient was placed in the supine position and head halter traction with  5 pounds traction.  The neck was prepped with DuraPrep, the arms were tucked  to the side, the ulnar nerves were protected.  The usual prepping and  draping was performed.  The incision was made starting at the midline and  extending to the left.  The platysma was divided in line with the fibers.  The platysma was elevated since this was a three-level exposure.  Large  anterior osteophytes were identified and the middle large spur was C4-5.  It  was confirmed with a crosstable C-arm.  Diskectomy was performed at this  level.  The canal was narrowed down to 6 mm.  There was a combination of  hard and soft disk present and self-retaining Cloward retractors were placed  right and left with two smooth blades up and down.  Using a power drill,  micropituitary Cloward curettes, a diskectomy was performed at C4-5 and then  the C5 vertebra was taken using the drill.  The self-retaining retractor was  moved down to C5-6 and the disk space was removed using the operating  microscope.  The posterior longitudinal ligament was taken down, dura was  exposed and then the  remaining portion of C5 was drilled out, leaving bone  lateral, the same width of the trial sizers for the allografts, which was 15  mm.  After use of some FloSeal for epidural veins, the operative field was  dry.  Kerrisons 1 and 2 mm were used to remove the spurs out right and left.  Once the spurs were completely decompressed, the fibula was opened and cut  and custom-fit in position.  It had to be thinned slightly with traction  applied the C.R.N.A.  It was placed, checked under C-arm, and was in a good  position.  The self-retaining retractor was then moved up to the C3-4 level  and at this level, diskectomy was performed.  There was minimal motion,  large spurs, and less severe stenosis.  The canal was narrowed down to 8 mm  at this level and once the posterior longitudinal ligament was taken down  using the operating microscope the dura was decompressed, right and left  gutters decompressed.  Trial spacer was appropriate for 6 mm.  The fibular  piece  that was left was cut.  As the plug was being inserted, the plug  cracked.  The trial sizer was again tried.  A second piece of fibula was  cut, beveled, and as it was fixed on lateral C-arm, it appeared to be  slightly too far anterior.  It was counter sunk a little further and again  the fibular piece cracked.  After this was repeated a third time, since the  fibula was somewhat small, custom allograft was opened, 6 mm was inserted in  routine fashion with excellent fit and fill and no problems.  Checked under  fluoroscopy and then a 50 mm plate, EBI/[employer].  The plate was fixed at four  corners, adjusted.  One 5 mm screw was placed superior on the right at C3.  The rest were 4.5 mm screws.  No screws were placed in the graft, so a total  of six screws were used, two at C3, two at C4 and two at C6.  The operative  area was irrigated.  Patty count was off, and a crosstable lateral x-ray was  taken, which showed that there was no retained  patty.  The graft and plate  were in good position.  Fluoroscopic spot films  were then taken.  A Hemovac was placed through a separate stab incision with  in-and-out technique.  Platysma was closed with 3-0 Vicryl and 4-0 Vicryl  subcuticular closure, tincture of Benzoin and Steri-Strips, Marcaine  infiltration, postop dressing, soft cervical collar, and transferred to the  recovery room neurologically intact.      Mark C. [name], M.D.  Electronically Signed     MCY/MEDQ  D:  07/21/2005  T:  07/22/2005  Job:  [id number]

## 2010-06-21 NOTE — H&P (Signed)
NAMEKRISS, Luis Wolfe NO.:  [id number]   MEDICAL RECORD NO.:  [id number]          PATIENT TYPE:  IPS   LOCATION:  [id number]                         FACILITY:  [facility]   PHYSICIAN:  Meredith Staggers, M.D.DATE OF BIRTH:  [birth year]   DATE OF ADMISSION:  08/11/2005  DATE OF DISCHARGE:                                HISTORY & PHYSICAL   CHIEF COMPLAINT:  Weakness and confusion.   HISTORY OF PRESENT ILLNESS:  This is a 70 year old white male with a history  of hypertension, coronary artery disease and severe cervical spondylosis  with stenosis who ultimately required a C5 corpectomy and C3 through C6 ACDF  by Dr. [name] on June 18. His postoperative course was complicated by a  hematoma requiring I&D of pharyngeal edema. The patient required intubation  and an open gastrostomy tube. ENT was following for edema and respiratory  issues and ultimately extubated the patient on July 3. In the interim, the  course was complicated by Gram negative pneumonia and questionable  epiglottitis. The patient was on IV Zosyn until August 04, 2005. A modified  barium swallow was done July 5 with significant oropharyngeal dysphagia. The  patient remains n.p.o. and is on water protocol. The patient still with  judgment and awareness issues per chart.   REVIEW OF SYSTEMS:  Positive for decreased sleep, numbness in the right  greater than left hand, neck pain. Denies constipation. He has had  occasional nocturia and dizziness when moving.   FAMILY HISTORY:  Positive for hypertension, coronary artery disease,  gastroesophageal reflux disease, gout, carpal tunnel syndrome on the right,  diverticulosis, peripheral neuropathy bilateral upper extremities,  obstructive sleep apnea, allergic rhinitis, anxiety, history of pancreatitis  due to alcohol abuse, umbilical hernia repair, bilateral shoulder bursitis,  likely rotator cuff syndrome. Family history positive for stroke and  coronary artery  disease.   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 He does not smoke. He quit alcohol 17 years ago.   ALLERGIES:  MOTRIN.   MEDICATIONS AT HOME:  1.  __________ 500 mg t.i.d.  2.  Colchicine 0.6 mg t.i.d.  3.  Cozaar daily.  4.  Paxil daily.  5.  Pravachol 20 mg daily.  6.  Nexium 40 mg daily.   LABORATORY DATA:  Hemoglobin 13.5, white count 7.3, platelets 262,00, sodium  137, potassium 4.2. BUN and creatinine 12 and 1.1.   PHYSICAL EXAMINATION:  VITAL SIGNS:  The patient is afebrile. Blood pressure  is 120/72, pulse is 84, respiratory rate 16.  GENERAL:  The patient is pleasant in no acute distress. He is alert and  oriented x3.  HEENT:  Pupils equal round and reactive to light and accommodation.  Extraocular eye movements are intact. Ear, nose and throat exam is  unremarkable.  NECK:  The patient is in a soft cervical collar. The wound is clean and  intact at the neck.  CHEST:  Clear to auscultation bilaterally without wheezes, rales or rhonchi.  HEART:  Regular rate and rhythm without murmurs, rubs or gallops.  ABDOMEN:  Soft, nontender, appropriately tender around the G tube. Minimal  drainage was noted at the G tube site. No erythema was seen.  NEUROLOGIC:  Cranial nerves II-XII are grossly intact. Reflexes are 1+ and  2+. Sensation decreased in the bilateral upper extremities, right greater  than left today but he was inconsistent there. Judgment and orientation was  impaired. The patient recalled year and month but not day. He needed cues  for place. He had problems with short and mid-term memory. Mood was  generally bright. He did appear impulsive and lacked good judgment.  EXTREMITIES:  No cyanosis, clubbing or edema was seen. The patient did have  a Candidal rash in the inguinal region. On motor function, the patient's  left upper extremity was 4+ to 5/5, right upper extremity was 4/5  proximally, 3+ to 4  distally of the hand intrinsic's. Biceps movement was  4/5, triceps 4+/5. Bilateral lower extremity movement generally was 4 to  4+/5 with no obvious pain inhibitor or weakness noted.   ASSESSMENT/PLAN:  1.  Functional deficit secondary to cervical myelopathy and stenosis status      post anterior cervical diskectomy fusion at C3 through C6 with C5      corpectomy complicated by laryngeal edema and perioperative hematoma.      The patient's postoperative day #21 currently. The patient still with      ongoing confusion and judgment issues at this point. Begin comprehensive      inpatient rehab with physical therapy to evaluate and treat for lower      extremity use, strengthening and balance. Occupational therapy will      evaluate and treat for upper extremity use and activities of daily      living. Speech language pathology will assess for cognitive swallowing      difficulties and treat as indicated. 24-hour rehab nursing will assess      for bowel, bladder, skin, medication, and safety issues and treat as      appropriate. Rehab Education officer, museum or case manager will assess her      psychosocial needs and disposition. Estimated length of stay 2+ weeks.      Prognosis fair. Goals, modified independent, intermittent supervision.  2.  Pain management with oxycodone p.r.n.  3.  Deep venous thrombosis prophylaxis with mobilization and TEDs.  4.  Hyperglycemia. Continue to monitor CBGs and cover with Lantus insulin.      Will observe as we change to bolus feeding.  5.  Dysphagia. Continue tube feeds for now with water protocol only.  6.  Coronary artery disease. Continue Lopressor 12.5 mg b.i.d.  7.  Reflux.  Protonix 40 mg b.i.d.  8.  Altered mental status. Will try to limit potentially neuro sedating      medications. Will try to decrease Reglan as possible. Check sleep cycle.      Await speech, language, pathology evaluation.     Meredith Staggers, M.D.  Electronically Signed      ZTS/MEDQ  D:  08/11/2005  T:  08/11/2005  Job:  [id number]

## 2010-06-21 NOTE — Consult Note (Signed)
NAMETAYTE, LOREK NO.:  [id number]   MEDICAL RECORD NO.:  [id number]          PATIENT TYPE:  INP   LOCATION:  2109                         FACILITY:  [facility]   PHYSICIAN:  Odis Hollingshead, M.D.DATE OF BIRTH:  1940/11/06   DATE OF CONSULTATION:  07/25/2005  DATE OF DISCHARGE:                                   CONSULTATION   Physician requesting this is Dr. [name].   REASON FOR CONSULTATION:  Need for a gastrostomy tube.   HISTORY OF PRESENT ILLNESS:  This is a 70 year old male who underwent a  cervical procedure as discussed in Dr. [name] June 18, [id number], complicated by a  postoperative hematoma requiring reoperation on June 19 and again on July 22, 2005.  Dr. Ardis Hughs was then consulted to evaluate the pharyngeal area and  possibly pass a feeding tube.  She noted the pharyngeal area to be extremely  swollen with some laceration and did not think putting in a nasoenteric  feeding tube was safe.  I was subsequently asked to see him to place a  surgical gastrostomy.  The patient is currently intubated and under general  anesthesia.   PHYSICAL EXAMINATION:  GENERAL APPEARANCE:  He has a nasotracheal tube in.  ABDOMEN:  Soft.  There is a small umbilical scar.  It is mildly obese.   IMPRESSION:  Severe pharyngeal edema and hematoma following a cervical spine  surgery.  Needs enteral access, and it is unsafe to place it nasoenterically  because of evidence of a pharyngeal laceration and swelling.   PLAN:  I went out and spoke with his wife in the operating room waiting  area.  We explained the options to her of waking him up and discussing this  with him, although he has been intubated on a ventilator, or proceeding with  an open gastrostomy.  We would proceed with this as an emergency.  She would  like [place] to go ahead and proceed with the open gastrostomy.  I discussed the  procedure and risks including but not limited to bleeding, infection, tube  problems and  leakage from the gastrostomy tube requiring revision.  I also  told her it could be a temporary or permanent tube.  She seems to understand  this.      Odis Hollingshead, M.D.  Electronically Signed     TJR/MEDQ  D:  07/25/2005  T:  07/25/2005  Job:  [id number]

## 2010-06-21 NOTE — Discharge Summary (Signed)
NAMEAKHARI, Luis NO.:  [id number]   MEDICAL RECORD NO.:  [id number]          PATIENT TYPE:  IPS   LOCATION:  [id number]                         FACILITY:  [facility]   PHYSICIAN:  Meredith Staggers, M.D.DATE OF BIRTH:  [birth year]   DATE OF ADMISSION:  08/11/2005  DATE OF DISCHARGE:  08/15/2005                                 DISCHARGE SUMMARY   DISCHARGE DIAGNOSES:  1.  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  2.  Postoperative obstructing laryngeal edema, secondary to hematoma with      dysphagia.  3.  Hypertension.  4.  Coronary artery disease.  5.  Acute blood loss anemia.   HISTORY OF PRESENT ILLNESS:  Mr. Whack is a 70 year old man with a history  of hypertension, coronary artery disease, cervical spondylosis with hint of  cervical stenosis requiring C5 corpectomy with C3-C6 ACDF by Dr. [name] on  June 18.  Postop course complicated by hematoma requiring I&D on July 22, 2005 with pharyngeal edema.  He required intubation for airway support.  Gastrostomy tube done on July 25, 2005 for nutrition supported by Dr.  [name].  Dr. [name] was consulted secondary to pharyngeal edema and has  been following along.  The patient's hospital course has been complicated by  gram-negative HAP/VAP question due to epiglottitis.  Has been treated with  IV Zosyn per ID input.  Antibiotics discontinued on August 04, 2005.  On August 05, 2005 the patient underwent microdirect laryngoscopy with extubation by  Dr. [name].  MBS  on August 07, 2005 show significant oropharyngeal dysphagia.  The patient continues to be n.p.o.  Therapy is initiated and the patient is  progressing along.  Noted to have impairment in mobility and self-care and  rehab consulted for further therapies.   PAST MEDICAL HISTORY:  1.  Significant for hypertension.  2.  Coronary artery disease.  3.  PCTA.  4.  GERD.  5.  Gout  6.  Right carpal tunnel syndrome.  7.  Diverticulosis.  8.  Peripheral neuropathy bilateral upper extremities.  9.  OSA.  10. Allergic rhinitis.  11. Anxiety.  12. History of pancreatitis.  13. Umbilical hernia repair.  14. Bilateral shoulder bursitis.   ALLERGIES:  Motrin.   FAMILY HISTORY:  Positive for CVA  and coronary artery disease.   SOCIAL HISTORY:  The patient is married, works in a Human resources officer, and  was working in a Human resources officer prior to admission.  Does not use any  tobacco.  Has history of alcohol abuse, quit 17 years ago.   HOSPITAL COURSE:  Mr. Jerramy Furtado was admitted to rehab on August 09, 2005.  The patient's therapy consisted of PT/OT and speech therapy.  Past  admission, patient was maintained on n.p.o. status.  His acute feeds were  advanced to bolus tube feeds and Reglan was tapered off.  Currently, patient  tolerating all his tube feeds at 340 mL 5 times a day followed by 140 mL of  H20 flushes.   Followup labs done revealed hemoglobin 11.9, hematocrit 35.8, white count  8.7,  platelets 471.  Check of electrolytes revealed sodium 138, potassium  3.5, chloride 104, CO2 28, BUN 12, creatinine 1.0, glucose 128.  Hemoglobin  A1c normal at 5.7.  LFTs elevated with AST 75, ALT 60, alkaline phos 127,  total bilirubin 0.6.  Patient was initially maintained on Lantus insulin 10  units subcutaneous q.h.s. with sliding scale insulin.  As patient's mobility  improved blood sugars noted to be improving and the patient has been tapered  off Lantus insulin.  Currently blood sugars ranging from 60s-120s range.   A repeat MBS was done on August 14, 2005 revealing severe dysphagia secondary  to pharyngeal retention of boluses.  Posterior pharyngeal wall appears to be  swollen, narrowing the throat.  Epiglottis inward 50% of time than  approximated posterior pharyngeal wall.  No aspiration observed; however,  patient at risk for aspirating for pharyngeal residue; therefore, patient  remains n.p.o.  He will continue to follow up  outpatient speech therapy to  include E-stim to help strengthen swallow.  Repeat MBS in the next few  weeks.   The patient has participated in therapy and has progressed along well.  Patient is currently at modified independent levels for ADLs, modified  independent for transfers, modified-independent for ambulating in a  supervised setting for short distances.  He is able to ambulate greater than  200 feet with supervision for a straight cane.  The patient will continue to  receive further followup therapies past discharge.  Currently, patient's  family deciding home health therapy  versus [facility] outpatient depending  on availability of [facility] past discharge.  On August 15, 2005,  patient is discharged to home.   DISCHARGE MEDICATIONS:  1.  Paxil 20 mg.  2.  Probenecid 500 mg b.i.d.  3.  Pravachol 20 mg q.h.s.  4.  Nexium 40 mg a day.  5.  Aspirin  81 mg a day.  6.  Trazodone 50 mg q.h.s.  7.  Lopressor 25 mg a day.  8.  Osmolite 340 mL, 7 a.m., 11 a.m., 3 p.m., 7 p.m., and 10 p.m., followed      by 140 mL water boluses past each feed.  9.  Colchicine 0.6 mg q.h.s.  10. Ocean Nasal Spray and saline nasal spray 1 squirt b.i.d.  11. Keflex 500 mg p.o. q.i.d. per tube.   DIET:  Patient n.p.o.  May do ice chips for past water protocol.   ACTIVITY:  1.  At intermittent supervision.  2.  No driving.  3.  No smoking.  4.  No alcohol.  5.  No strenuous activity.  6.  Special instructions to wear soft collar on at all times.  May use a      hard collar for showering.   FOLLOWUP:  1.  Patient to follow up with Dr. [name] in the next couple of weeks.  2.  Follow up with Dr. [name] for gastrostomy tube removal.  3.  Follow up with Dr. Purcell Nails in 2 weeks for routine medical care.  4.  Follow up with Dr. Loren Racer as needed.  5.  Follow up with Dr. Naaman Plummer as needed.      Thornton Dales, P.A.     Meredith Staggers, M.D.  Electronically Signed     PP/MEDQ  D:  08/15/2005  T:  08/16/2005  Job:  [id number]   cc:   Norris Cross., M.D.  Fax: [phone]   [name], MD  Fax: [phone]   Thana Farr.  [name], M.D.  Fax: [phone]   Odis Hollingshead, M.D.  [address]  [place]  [place] [address]   Minus Breeding, M.D.  [phone]  [place] [address]

## 2010-06-21 NOTE — Consult Note (Signed)
Luis Wolfe, Luis Wolfe                ACCOUNT NO.:  [id number]   MEDICAL RECORD NO.:  [id number]          PATIENT TYPE:  OIB   LOCATION:  2109                         FACILITY:  [facility]   PHYSICIAN:  Chesley Mires, M.D.      DATE OF BIRTH:  06-27-1940   DATE OF CONSULTATION:  07/22/2005  DATE OF DISCHARGE:                                   CONSULTATION   REASON FOR CONSULTATION:  Ventilator management.   HISTORY OF PRESENT ILLNESS:  Luis Wolfe is a 70 year old male who was  admitted on June18 for C6 spondylolysis and cervical spine surgery.  He had  a relatively uneventful procedure on June 18 and he was subsequently  extubated in the recovery room. However, postoperatively he was noted to  have swelling in his neck and as a result he needed to have exploration of  his surgical wound with evacuation of a hematoma. He was intubated again  prior to this procedure, however, he was a difficult airway and apparently  he had to be intubated through an LMA.  After the exploration, he was again  in the recovery room and was subsequently extubated.  However, he appeared  to developed worsening of his neck swelling and again had to undergo re-  exploration of his wound. Again, he was a very difficult airway and actually  had to be intubated nasally this time. He is currently heavily sedated in  the recovery room but appears to be hemodynamically stable, otherwise.   PAST MEDICAL HISTORY:  Significant for carpal tunnel syndrome, shoulder  bursitis, gout, gastroesophageal reflux disease, pancreatitis, coronary  disease, allergic rhinitis, anxiety, alcohol abuse although he appears to  have had his last drink several years ago, hypertension, hiatal hernia,  diverticulosis, elevated cholesterol, and obstructive sleep apnea.   PAST SURGICAL HISTORY:  Significant for tonsillectomy, umbilical hernia  repair, carpal tunnel release, and multiple shoulder injections.   MEDICATIONS:  Aspirin 81 mg daily,  Benemid 500 mg t.i.d., colchicine 0.6 mg  t.i.d., Cozaar 50 mg daily, Paxil 25 mg daily, Protonix 80 mg daily, Zocor  10 mg daily.   ALLERGIES:  He has an allergy to [facility] which apparently causes him to  develop swelling.   FAMILY HISTORY:  Significant for his father who had arthritis and a stroke,  his mother had coronary artery disease, and his wife has multiple sclerosis.   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  He has two  children.  There is no history of tobacco use.  He has a history of heavy  alcohol use but has been abstinent recently.   REVIEW OF SYSTEMS:  Unable to obtain at this time.   PHYSICAL EXAMINATION:  GENERAL:  He is seen in the recovery room.  He is sedated and intubated  nasally.   VITAL SIGNS:  Temperature is 98.1, blood pressure is 157/71, heart rate is  103 regular, oxygen saturation 95%, respiratory rate 16. His ventilator  settings are PRBC of 12, tidal volume 550, FIO2 is 30, and PEEP is 5.  His  peak pressure is  27, plateau pressure is 20, and mean airway pressure is 8.  HEENT:  Pupils are reactive.  He has a nasotracheal tube in place.  There  are no oral lesions.  He has a soft cervical collar in place.  HEART:  S1-  S2, regular rate and rhythm.  CHEST:  There are no wheezing or rales.  ABDOMEN:  Soft, nontender, positive bowel sounds.  EXTREMITIES:  There is no edema, cyanosis, clubbing.  GU:  There is no lesions.  NEUROLOGIC EXAM:  He is arousable and opens his eyes with stimulation.  He  is able to move all four extremities but then he quickly falls back to  sleep.   LABORATORY DATA:  WBCs 14.4, hemoglobin 12.3, hematocrit 36, platelet count  311.  Sodium 137, potassium 4.2, chloride is 102, CO2 is 26, BUN is 12,  creatinine 1.1, glucose is 105.  PT is 13, INR is 1, PTT is 30. Calcium is  9.4.   IMPRESSION:  Cervical spondylosis status post surgery with the postoperative  course complicated by neck swelling and  hematoma with re-exploration x2 with  difficult airway management and is now intubated nasally.   PLAN:  The plan would be to leave him on the ventilator for a least 48  hours.  We will keep him on the moderate to heavy sedation protocol and we  will take a lead from orthopedics as far as appropriate timing to consider  weaning from the ventilator. Additionally, in the meantime, we will keep him  on IV Protonix for peptic ulcer disease prophylaxis and compression  stockings for DVT prophylaxis and we will also give him p.r.n. nebulizer  treatments.  I have also been adjustments in his ventilator settings to  decrease his tidal volume to 550 and his FIO2  to 30% with the goal to keep  his SAO2 greater than 92%.  I will follow-up an arterial blood gas to  determine his acid base status as well as his oxygenation and I will also  follow-up on his chest x-ray.      Chesley Mires, M.D.  Electronically Signed     VS/MEDQ  D:  07/22/2005  T:  07/22/2005  Job:  [id number]

## 2010-06-21 NOTE — Op Note (Signed)
NAMEIVIN, KONECNY NO.:  [id number]   MEDICAL RECORD NO.:  [id number]          PATIENT TYPE:  INP   LOCATION:  [id number]                         FACILITY:  [facility]   PHYSICIAN:  [name], MD         DATE OF BIRTH:  [birth year]   DATE OF PROCEDURE:  07/31/2005  DATE OF DISCHARGE:  08/11/2005                                 OPERATIVE REPORT   PREOPERATIVE DIAGNOSIS:  Obstructing laryngeal edema with posterior  oropharyngeal wall laceration.   POSTOPERATIVE DIAGNOSIS:  Obstructing laryngeal edema with posterior  oropharyngeal wall laceration.   PROCEDURE:  Micro direct laryngoscopy with rigid esophagoscopy.   SURGEON:  Dr. [name].   ANESTHESIA:  General endotracheal anesthesia.   ESTIMATED BLOOD LOSS:  None.   COMPLICATIONS:  None.   INDICATIONS:  This patient is a 70 year old male with some oropharyngeal  hemorrhage history.  He underwent cervical fusion on 07/21/2005.  He has  required two postoperative returns to the operating room for evaluation of  neck swelling and to evacuate hematoma with control of bleeding from the  superior laryngeal vessels on the left.  The second return to the operating  room was an extremely difficult intubation which could not be achieved using  the Glide scope.  This required left transnasal fiberoptic intubation which  was done with difficulty.  There is a significant amount of the oral blood  was encountered at the time of this intubation.  The oral bleeding developed  with a clot about 7:00 p.m. the night following the original surgery.  He is  not on any blood thinners.  He has since undergone direct laryngoscopy on  07/25/2005 which revealed purulent fluid and marked swelling with  epiglottitis type appearance and hematoma of the epiglottis.  Photographs  were taken and remained in the chart.  Posterior oral pharyngeal wall  laceration was evaluated.  The patient is taken to the operating room on  this occasion to  reevaluate and for hopeful extubation.   FINDINGS:  The patient was noted to have purulent fluid in the endotracheal  tube.  There is a substantial decline in edema of the endolarynx but not  sufficient to withstand extubation.  There is a normal esophagus without  perforation.  The posterior oropharyngeal wall laceration was healing.   PROCEDURE:  The patient was taken to the operating room and placed on the  table in the supine position.  He was then placed under general anesthesia.  A Jako laryngoscope was then used to visualize the endolarynx and 0 degrees  Storz Hopkins endoscope was used to photograph the findings of the posterior  pharyngeal wall laceration in the endolarynx.  There is significant purulent  fluid and ongoing edema of the endolarynx.  The other findings are as noted  above.  A #8 Jesberg rigid esophagoscope was then placed into the posterior  oropharyngeal area and carefully advanced in the postcricoid region down the  esophagus with visualization of the lumen at all times.  The findings  were as noted above.  Please note that a mouth protector  on the upper teeth  was placed.  This was for protection of the gingiva.  The patient remained  intubated.  The table was rotated clockwise 90 degrees to its original  position.  The patient was returned to the intensive care unit in stable  condition.      [name], MD  Electronically Signed     SJ/MEDQ  D:  11/23/2005  T:  11/24/2005  Job:  [id number]

## 2010-06-21 NOTE — Op Note (Signed)
NAMEABDULMOHSEN, SIEFKEN NO.:  [id number]   MEDICAL RECORD NO.:  [id number]          PATIENT TYPE:  INP   LOCATION:  [id number]                         FACILITY:  [facility]   PHYSICIAN:  [name], MD         DATE OF BIRTH:  30-Sep-1940   DATE OF PROCEDURE:  07/25/2005  DATE OF DISCHARGE:  08/11/2005                                 OPERATIVE REPORT   PREOPERATIVE DIAGNOSIS:  Oropharyngeal hemorrhage.   POSTOPERATIVE DIAGNOSIS:  Oropharyngeal hemorrhage, with hypopharyngeal  hematoma, cellulitis, obstructing laryngeal edema, laceration posterior  pharyngeal wall.   PROCEDURE:  Micro direct laryngoscopy with attempted feeding tube placement.   SURGEON:  Dr. [name].   ANESTHESIA:  General.   ESTIMATED BLOOD LOSS:  None.   COMPLICATIONS:  None.   INDICATIONS:  The patient is a 70 year old male who underwent cervical  fusion on 06/18.  He required to postoperative returns to the operating room  for evaluation neck swelling and evacuation hematoma with control of  bleeding secondary to the superior laryngeal vessels on the left.  The  second return to the operating room was a very difficult intubation which  could not be achieved using the Glide scope and required transnasal  fiberoptic intubation with some difficulty.  There is significant oral  bleeding was clotting at that time.  Fiberoptic laryngoscopy was performed  at the bedside this revealed some blood clot and a small amount of  oropharyngeal blood.  The vocal cords could not be visualized.  And for this  reason, the patient is taken to the operating room for visualization of the  endolarynx.   FINDINGS:  The patient was noted to have cherry type appearance to the  epiglottis with extreme swelling.  There was pus in multiple areas.  There  was purulent fluid in the endotracheal tube and a posterior oropharyngeal  wall small laceration.   PROCEDURE:  The patient was taken to the operating room and placed on  the  table in the supine position.  He came to the operating room intubated and  was then placed under general anesthesia.  A upper dental mouth guard was  placed.  A Jako laryngoscope was placed intraorally and a 0 degrees Storz  Hopkins endoscope attached to a camera was used to make photographs of the  larynx and posterior pharyngeal wall.  These were placed in the chart at the  end of the case.  A culture was obtained for STAT Gram's stain and culture  and sensitivity.  Suctioning of the  purulent fluid was then performed.  Esophagoscopy could not be performed due  to the significant edema.  The patient was left intubated in the table  rotated clockwise 90 degrees to its original position.  The patient was then  returned to the intensive care unit in stable condition.  There were no  complications.      [name], MD  Electronically Signed     SJ/MEDQ  D:  11/23/2005  T:  11/24/2005  Job:  [id number]

## 2010-06-21 NOTE — Op Note (Signed)
Luis Wolfe, Luis Wolfe NO.:  [id number]   MEDICAL RECORD NO.:  [id number]          PATIENT TYPE:  INP   LOCATION:  2109                         FACILITY:  [facility]   PHYSICIAN:  [name], MD         DATE OF BIRTH:  October 13, 1940   DATE OF PROCEDURE:  07/31/2005  DATE OF DISCHARGE:                                 OPERATIVE REPORT   PREOPERATIVE DIAGNOSES:  1.  Obstructing laryngeal edema.  2.  Posterior oropharyngeal wall laceration.   POSTOPERATIVE DIAGNOSES:  1.  Obstructing laryngeal edema.  2.  Posterior oropharyngeal wall laceration.   PROCEDURE:  1.  Micro direct laryngoscopy.  2.  Rigid cervical esophagoscopy.   SURGEON:  [name], M.D.   ANESTHESIA:  General endotracheal anesthesia.   ESTIMATED BLOOD LOSS:  None.   COMPLICATIONS:  None.   INDICATIONS:  The patient is a 70 year old male who has been intubated for 7  days due to obstructing laryngeal edema.  He has undergone direct  laryngoscopy last week.  The patient was found to have obstructing  endolaryngeal edema and remains intubated.  For this reason, he is returned  to the operating room for reevaluation.  Further, the patient underwent  anterior cervical diskectomy with fusion and had a postoperative hematoma  which required drainage.  The concern is that there could possibly be an  esophageal perforation based on a significant amount of upper aerodigestive  tract purulence.  For this reason, esophagoscopy will also be performed.   FINDINGS:  The patient was noted to have a substantial reduction epiglottic  and endolaryngeal edema but not to the point of extubation level.  Further,  there was a posterior oropharyngeal wall superficial laceration which  appeared to be healing.  There is a mild amount of posterior oropharyngeal  edema.  The esophagus did reveal some reflux distally but no evidence of  perforation or hematoma.   PROCEDURE:  The patient was taken to the operating room and  placed on the  table in the supine position.  He was then placed under general anesthesia.  He was taken to the operating room intubated from the intensive care unit.  The patient's table was then rotated counterclockwise 90 degrees.  The neck  was gently extended.  A tooth guard was placed along the upper incisors for  protection of the dentition.  A Jako laryngoscope was then placed  intraorally with exposure of the endolarynx and epiglottis.  A 0 degree  Storz Hopkins endoscope with a camera attached was used for careful  visualization of the posterior pharyngeal wall and laryngeal structures.  The findings are as noted above.  Photographs were taken.  The laryngoscope  was suspended on the rigid suspension arm (Lewy).  This was performed for  better and more thorough evaluation of the endolarynx.  The patient's  laryngoscope was then removed and a #8 Jesberg esophagoscope was then placed  intraorally with examination of the posterior cricoid area in the upper  three-quarters of the esophagus.  The findings were as noted above.  It was  decided  that the patient was not yet ready for extubation due to the degree  of laryngeal edema.  For this reason, the patient was returned to his  intensive care bed and to the intensive care unit in stable condition,  remaining transnasally intubated.      [name], MD  Electronically Signed     SJ/MEDQ  D:  07/31/2005  T:  07/31/2005  Job:  [id number]   cc:   [facility]   Mark C. [name], M.D.  Fax: [phone]

## 2010-06-21 NOTE — Assessment & Plan Note (Signed)
[facility]                             PRIMARY CARE OFFICE NOTE   NAME:Koci, ROMELLE GLANTON                       MRN:          [id number]  DATE:10/16/2005                            DOB:          1940-03-02    Mr. Guevarra is  a very pleasant 70 year old Caucasian gentleman, last seen in  the office by Cardiology March 04, 2005, by Dr. Percival Spanish for followup of  carotid stenosis and coronary disease.  The patient has not been seen in the  primary care office since July 05, 2004.  Please see that dictation for a  complete past medical history, family history and social history.   INTERVAL HISTORY:  The patient was admitted to the hospital July 22, 2005,  for a C5 corpectomy, 3-level anterior discectomy with fibular strut grafting  and plating.  The patient had a stormy course with postoperative cervical  hematoma from a bleeder, requiring re-operation to control the bleeding and  evacuate the hematoma.  The patient subsequently had significant laryngeal  edema requiring intubation for ventilatory support.  He had a long hospital  course, and has acquired hospital/ventilator-acquired pneumonia, requiring  IV antibiotics.  He then required rehabilitation for about a week prior to  being discharged August 15, 2005.   DISCHARGE MEDICATIONS:  Included:  1. Paxil 20 mg a day.  2. Probenecid 500 mg b.i.d.  3. Pravachol 20 mg nightly.  4. Nexium 40 mg daily.  5. Aspirin 81 mg daily.  6. Trazodone 50 mg nightly.  7. Lopressor 25 mg daily.  8. Osmolite.  9. Colchicine 0.6 mg nightly.  10.Keflex 500 mg q.i.d.   Since hospital discharge the patient has made continued improvement.  He  initially had significant and severe dysphagia, and did have a gastrostomy  feeding tube that had been placed surgically because of his laryngeal edema  and inability to do PEG.  His attending physician team was Dr. Rodell Perna  for orthopedics, Dr. [name] for ENT, Dr. Halford Chessman for  critical care, Dr.  [name] for general surgery.   The patient has several questions at today's visit in regards to his medical  management.  After reviewing his hospital course, including discharge  dictations and present medications, I made the following recommendations:  1. Hypertension:  The patient's blood pressure at today's visit is 143/86.      He is tolerating Cozaar well.  PLAN:  Will continue Cozaar at 50 mg daily.  We will monitor his blood  pressure, and if it continues to be riding with his systolic greater than  [id number], would restart hydrochlorothiazide.  1. Recent psychiatric:  The patient has been treated for depression on a      long ongoing basis.  He has been doing very well.  At this time he is      taking both Paxil and Trazodone.  After discussing medication options,      we have decided to increase trazodone to 50 mg b.i.d. and to      concomitantly taper Paxil, taking it two days in a row and off a  day      for two cycles, taking it every other day for three cycles, taking it      on one day, off two days for two cycles, and then stopping.  We will      monitor his response carefully, and consider adding increased trazodone      if needed.  2. Gastrointestinal:  The patient was concerned about whether proton pump      inhibitors cause osteoporosis.  To my knowledge, this is not a reported      major side effect.  Given his recent esophageal and laryngeal edema, I      would want to minimize reflux.  PLAN:  The patient is to start on omeprazole 40 mg daily, generic.  1. Lipids:  The patient's last lipid profile from May 30, 2005, showed      an LDL of 111.  The patient has known CAD, status post PTCA and      stenting of the RCA and distal circumflex, therefore the goal for him      would be an LDL less than 80.  PLAN:  The patient is to increase Pravachol to 80 mg daily, and to continue  with Zetia 10 mg daily.  He will need followup lipid panel in  approximately  one month.  1. Cardiovascular:  Patient with known coronary artery disease as noted.      He has been on Plavix because of stent placement.  I would recommend      continuing Plavix at this time, and to discuss this with Dr. Percival Spanish      in terms of long-term treatment.   No physical exam was conducted at today's visit, although the patient's  vital signs were normal, with a blood pressure of 143/86.   OVERALL PLAN:  Continue with medications as outlined above.  The patient  will return in one month for a lipid panel to ensure adequate control.  He  is to follow up with Dr. Percival Spanish as instructed.  He is to follow up with  Dr. [name] as instructed.  The patient will return to see me in 4 to 6 weeks  to evaluate his response to increased Pravachol, and also to monitor his  response to trazodone as single agent.                                   Heinz Knuckles Norins, MD   MEN/MedQ  DD:  10/17/2005  DT:  10/18/2005  Job #:  [id number]   cc:   Thana Farr. [name], M.D.  Minus Breeding, MD, [facility]  Patient

## 2010-06-21 NOTE — H&P (Signed)
NAME:  Luis Wolfe, Luis Wolfe                          ACCOUNT NO.:  [id number]   MEDICAL RECORD NO.:  [id number]                   PATIENT TYPE:  INP   LOCATION:  5152                                 FACILITY:  [place]   PHYSICIAN:  Heinz Knuckles. Norins, M.D. [facility]         DATE OF BIRTH:  12-Mar-1940   DATE OF ADMISSION:  03/07/2002  DATE OF DISCHARGE:                                HISTORY & PHYSICAL   CHIEF COMPLAINT:  Abdominal pain.   HISTORY OF PRESENT ILLNESS:  The patient is a 70 year old married white male  with a prior history of pancreatitis who reports the onset early this  morning of abdominal pain.  This was in the upper abdominal area.  He  thought he had significant gas, tried to walk it off, but the pain kept  getting worse. He reports the pain was intermittent with severe spikes of  discomfort.  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   Initial evaluation revealed abdominal tenderness.  Plain films revealed  dilated bowel loops with question of small bowel obstruction.  Lab revealed  an elevated amylase at 186.  The patient reports he has had increased flatus  lately, some mild bowel incontinence.  The stool has been brown.  No  increased odor.  No fatty food intolerance but does have a prior history of  pancreatitis as noted.  The patient is now admitted for supportive care and  evaluation for possible cholelithiasis with common bile duct stone and also  for a small-bowel obstruction.   PAST MEDICAL HISTORY:  Surgical:  None.  Medical:  The patient has a history  of coronary artery disease with an admission in September for cardiac  catheterization which revealed an ejection fraction of 55%.  He had  significant RCA disease and underwent pecutaneous coronary intervention with  Cypher stenting, going from 90% to 0% obstruction. The patient had residual  disease with a distal 70% lesion in the RCA.  He had a 50% circumflex,  70%  stenosis in the PDA.  The patient has been managed medically on Plavix.  The  patient has a history of hyperlipidemia, history of GERD, history of  pancreatitis as noted, history of hypertension, history of gout, history of  recurrent right shoulder bursitis, history of diverticulosis, and history of  colon polyps with polypectomy in year 2000.   CURRENT MEDICATIONS:  1. Probenecid.  2. Colchicine 1 tablet b.i.d.  3. Ativan 2 mg b.i.d.  4. Tenex 2 mg daily.  5. Nexium 40 mg daily.  6. Plavix 75 mg daily.  7. Allegra 180 mg daily.  8. Estra-C 500 mg daily.  9. Fish oil 1000 mg b.i.d.  10.      Chromium picolinate 200 mcg daily.  11.      Vitamin B12 500 mg daily.  12.      Vitamin E 400 international  units daily.  13.      Aspirin 325 mg daily.  14.      Tylenol p.r.n.   FAMILY HISTORY:  Significant for coronary artery disease, otherwise  noncontributory.   SOCIAL HISTORY:  The patient is married in a stable marriage which is  supportive. He does not smoke.  He does not drink.   ADMISSION PHYSICAL EXAMINATION:  VITAL SIGNS:  Temperature 97.9, blood  pressure 138/72, pulse 80, respirations 18.  GENERAL: Well nourished, well developed white male who is comfortable at my  exam, but he has had morphine for pain.  HEENT:  Conjunctivae and sclerae clear. PERRLA.  NECK:  Supple without thyromegaly.  NODES:  No adenopathy is noted in cervical, supraclavicular, or inguinal  regions.  CHEST:  No CVA tenderness.  LUNGS:  Clear to auscultation and percussion.  CARDIOVASCULAR:  2+ radial pulse.  He had no JVD, no carotid bruits.  His  precordium was quiet.  He had a regular rate and rhythm without murmurs,  rubs, or gallops.  ABDOMEN:  Protuberant.  He has hypoactive bowel sounds in all four quadrants  with some rushes.  He had tenderness to palpation in the supraumbilical area  but no tenderness otherwise.  No guarding or rebound.  RECTAL:  Per EDP was heme-negative mucus.  No  masses in the rectal vault.  EXTREMITIES:  Without clubbing, cyanosis, or edema.  NEUROLOGIC:  Nonfocal exam.  SKIN:  Clear.   LABORATORY DATA:  Hemoglobin 14.9, hematocrit 44.8, white count 16,400, with  76% segs, 15% lymphs, 7% monos, platelet count 256,000.  Chemistries reveal  sodium138, potassium 3.7, chloride 106, CO2 22, BUN 15, creatinine 1,  glucose 126, SGOT 60, SGPT 27, total bilirubin 0.8, lipase elevated at 186.  No amylase is available.   X-ray by EDP report indicated dilated loops of bowel suggestive of small-  bowel obstruction.   CT scan is pending.   ASSESSMENT/PLAN:  1. Gastrointestinal.  The patient had onset of abdominal pain with increased     lipase, ileus, all consistent with recurrent pancreatitis.  The patient     has had pancreatitis in the past and do not recall if he has been     evaluated for cholelithiasis, although this is high on the list with     common bile duct stone as proximal cause of his pancreatitis.  Plan:  The     patient is to be admitted.  We will put him at bowel rest with only sips     and chips. Will get CT scan of the abdomen with contrast.  Will use     Demerol 25 IV q.6h. With Phenergan for pain.  Will consider GI consult or     general surgery consult if patient needs gallbladder surgery.  Will     continue his Nexium.  2. Cardiovascular.  The patient has known coronary artery disease as noted     above.  He currently is pain free with no angina.  During his hospital     stay, he was found to be intolerant of BETA BLOCKERS with significant     bradycardia.  He had MRA which revealed normal carotids.  Of note, the     patient did not have a myocardial infarction at his admission but chest     pain only.  Plan:  Will continue the patient's home medications.  If the     patient should require surgery, would need to discontinue Plavix.  With  the patient having no chest pain or chest    discomfort, there is no indication for a  telemetry bed.  3. Anxiety.  We will continue the patient's Ativan.  4. Gout.  Will hold colchicine and Probenecid at this time.                                               Heinz Knuckles Norins, M.D. [facility]    MEN/MEDQ  D:  03/07/2002  T:  03/07/2002  Job:  [id number]   cc:   Minus Breeding, M.D. [facility]  [address] [place] [address]  Fax: [phone]

## 2010-06-21 NOTE — Op Note (Signed)
NAMEYASIM, Luis Wolfe                ACCOUNT NO.:  [id number]   MEDICAL RECORD NO.:  [id number]          PATIENT TYPE:  OIB   LOCATION:  5007                         FACILITY:  [place]   PHYSICIAN:  Mark C. [name], M.D.    DATE OF BIRTH:  07-08-1940   DATE OF PROCEDURE:  07/22/2005  DATE OF DISCHARGE:                                 OPERATIVE REPORT   PREOPERATIVE DIAGNOSIS:  Status post C5 corpectomy, three-level anterior  discectomy, with fibular strut grafting and plating with expanding  postoperative cervical hematoma.   POSTOPERATIVE DIAGNOSIS:  Status post C5 corpectomy, three-level anterior  discectomy, with fibular strut grafting and plating with expanding  postoperative cervical hematoma.   PROCEDURE:  Exploration and evacuation of hematoma, anterior cervical  incision.   SURGEON:  Mark C. [name], M.D.   ANESTHESIA:  GOT.   ESTIMATED BLOOD LOSS:  Minimal, evacuation of clots.   BRIEF HISTORY:  This is a 70 year old male who, on the afternoon of the  18th, underwent anterior cervical fusion, which concluded about 12 hours  ago.  At the time of closure, the wound was dry.  A fibular strut graft had  been used.  There was no excessive bleeding and a Hemovac drain was placed.  There had been some Hemovac drainage.  The nurse did not empty the canister,  and the blood by estimate was 75-100 mL.  The patient had increased  difficulty breathing, felt like he was choking, and removal of the dressing  at 1:30 a.m. showed that he had an expanding hematoma of his neck, which the  Hemovac was not adequately decompressing or draining.  He was taken back to  the operating room before he developed respiratory arrest.  This was  explained to the patient and his wife and informed consent was obtained.   The patient was taken to the operating room with Dr. Tobias Alexander, as  anesthesiologist, Legrand Como __________ CRNA, and an airway was obtained using  LMA and intubating through the LMA.  Standard  prepping and draping was  performed.  Preoperative Ancef was given.  The area was cleared with towels,  Betadine and Bioderm applied, sterile __________ and thyroid sheath.  The  incision was opened up, spreading, with the tips of Metzenbaum scissors, and  the 0-Vicryl suture was cut, and this process was repeated, opening the  wound.  The platysma was divided.  There was no bleeding in the platysma.  Once this was opened up, there were obvious clots of blood with no active  bleeding present.  The clots were best removed by tonsil sucker, and clot  retractor was placed, as the clot was evacuated.  Prior to the closure of  this previous procedure, a fingertip was then placed down distally, toward  the chest, and some clots were suctioned from this area as well.  The  carotid sheath was dry.  There was one small vein on the longus coli that  appeared to might have been bleeding and was coagulated again with the  Bovie, and this was at the inferior aspect of C5 on the left.  The longus  coli on the right side was dry.  There was good position of the graft and  plate.  No hematoma was present on the cord and the patient had a normal  neurologic exam.  The wound was irrigated, some sponges were placed, and  then removed.  There were no areas of active bleeding present.  Repeat  irrigation, repeat sponge, pulling the sponge out, inspection with the  sucker tip, and basically other than small clots that had been removed,  there were no active bleeding sites.  The plate was intact.  The screws were  in good position.  The grafts were in good position.  Two Hemovac drains  were placed, and then the wound was re-closed in a standard fashion, closing  the platysma with interrupted sutures, and then a Vicryl 4-0 skin suture.  Tincture of , Benzoin, and Steri-Strips, and application of a dressing and  then soft collar.  The patient was breathing well, was extubated, and  transferred to the recovery  room, where he was breathing much more  comfortably.  Instrument count and needle count were correct.  The patient  will remain in the recovery room overnight for observation of his  respiratory status and remain on the pulse oximeter, etcetera.  Condition on  transfer to the PACU was improved.      Mark C. [name], M.D.  Electronically Signed     MCY/MEDQ  D:  07/22/2005  T:  07/22/2005  Job:  [id number]

## 2010-06-21 NOTE — Discharge Summary (Signed)
NAME:  Luis Wolfe, Luis Wolfe                          ACCOUNT NO.:  [id number]   MEDICAL RECORD NO.:  [id number]                   PATIENT TYPE:  INP   LOCATION:  3711                                 FACILITY:  [facility]   PHYSICIAN:  Davis Gourd, P.A. [facility]            DATE OF BIRTH:  March 12, 1940   DATE OF ADMISSION:  10/29/2001  DATE OF DISCHARGE:  10/31/2001                           DISCHARGE SUMMARY - REFERRING   PROCEDURE:  1. Cardiac catheterization.  2. Coronary arteriogram.  3. Left ventriculogram.  4. Percutaneous coronary angiography and stent of one vessel.   HISTORY OF PRESENT ILLNESS:  The patient is a 70 year old male with no known  history of coronary artery disease, who was referred by Dr. Heinz Knuckles.  Norins to cardiology for evaluation of exertional chest pain.  He had  repeated episodes of chest pressure that was relieved with rest, and was  associated with some nausea.  A cardiac catheterization was recommended and  he was admitted for this on October 29, 2001.   HOSPITAL COURSE:  The cardiac catheterization showed a long 50%-70% stenosis  in the LAD, and a 50% stenosis in the circumflex.  The RCA had a 30%  proximal and 99% mid, and a 50%-60% distal stenosis.  His ejection fraction  was 55% with mild inferior hypokinesis.  It was felt that a percutaneous  intervention on the RCA was indicated.  A  PTCA  and a Cypher stent to the  RCA, reducing the stenosis from 99% to 0%.  The distal RCA stenosis was  evaluated, but it was felt that it was not flow-limiting and did not need  treatment.  An additional stenosis was seen in the PDA of 70%.  This was  also not flow-limiting.  He tolerated the procedure well and the sheath was  removed without difficulty.  His groin was stable and he was otherwise ready  for discharge, but he had a substantial pause of 2.4 seconds seen on the  monitor.  He was asymptomatic with this.  Additionally, he had a heart rate  that dropped into  the 30s at times overnight.  He had had a beta blocker  added to his medication regimen at his office visit, and denies any syncope  or presyncope symptoms, as well as any awareness that his heart was beating  more slowly than normal.  Because of this he was held overnight and the beta  blocker dose was decreased from Toprol L 50 mg to Toprol XL 25 mg p.o. q.d.  Overnight his heart rate dropped into the 40s at times, but this was during  sleep, and he was asymptomatic.  He received Toprol XL 25 mg at 10, and at  11:45 he had a 2.8 second pause.  The patient was standing and talking to  someone at the time, and had no symptoms from this.  He was evaluated by Dr.  Minus Breeding, who  felt that discontinuing the beta blocker was the best  option.  Additionally he is to get an outpatient Holter monitor.  This will  be followed up as an outpatient.  There is not currently an indication for a  pacemaker.  The patient, prior to admission, had three episodes of confusion where he  knew his name and was able to recognize his wife, but felt that his  surroundings were unfamiliar and could not remember where the bedroom was,  or why he was coming to the hospital, and things of this nature.  These  episodes were not associated with any focal weaknesses, according to his  wife, and he was not dizzy, and complained of no systemic symptoms.  Each  episode lasted for approximately 15 minutes, and resolved spontaneously.  The previous episode in January was evaluated with a CT of the head, and  additionally in April 2000, he had an MRA of the carotids and the  intracranial arteries.  The CT showed atrophy with small vessel-type changes  and arteriosclerotic changes.  The MRA of the carotid and vertebral arteries  was negative.  The MRA of the brain was negative as well in 2000.  He had no  carotid bruits by exam and no visual defects.  He is to follow up with Dr.  Linda Hedges within two weeks as an outpatient,  and a message was left.  With the residual coronary artery disease he is to get an outpatient stress  test.  He is to remain out of work until the stress test is performed, to  see if the residual lesions in the LAD, circumflex, and RCA are causing  ischemia.  In the meantime he is to follow cardiac rehab guidelines for  increasing his activity and starting a walking program.  Additionally he is  to adhere more tightly to a low-fat, heart-healthy diet.  A Holter monitor  will be arranged as an outpatient.   DISPOSITION:  With the patient having no further pauses and asymptomatic, he  was stable for discharge on September 28. 2003.   LABORATORY DATA:  Hemoglobin 12.5, hematocrit 37.0, wbc's 6.8, platelets  256.  Sodium 138, potassium 4.1, chloride 106, CO2 of 27, BUN 10, creatinine  1.0, glucose 121.  Post-procedure CK-MB 161/4.5.  Total cholesterol 195,  triglycerides 133, HDL 38, LDL 130.   CONDITION ON DISCHARGE:  Improved.   DISCHARGE DIAGNOSES:  1. Unstable anginal pain, status post percutaneous coronary angiography and     stent to the right coronary artery this admission.  2. Preserved left ventricular function with an ejection fraction of 55% by     cardiac catheterization this admission.  3. Residual coronary artery disease with 50% and 70% left anterior     descending coronary artery lesions as well as a 50% circumflex and a 50%-     60% right coronary artery stenosis by cardiac catheterization.  There is     also a 70% stenosis in the posterior descending artery.  4. Hyperlipidemia.  5. Gastroesophageal reflux disease symptoms.  6. History of an allergy to IBUPROFEN with swelling.  7. Hypertension.  8. Family history of coronary artery disease.  9. History of pancreatitis.  10.      History of gout.  11.      Right shoulder bursitis.  12.      Obesity.  13.      History of diverticulosis.  14.      Colonoscopy with polypectomy in 2000. 15.  Bradycardia with sinus  pauses up to 2.8 seconds.  Holter monitor as     an outpatient.  16.      Episodic altered level of consciousness, follow up with Dr. Linda Hedges     within two weeks.                                               Davis Gourd, P.A. [facility]    RG/MEDQ  D:  10/31/2001  T:  11/02/2001  Job:  [id number]   cc:   Heinz Knuckles. Norins, M.D. [facility]

## 2010-06-21 NOTE — Op Note (Signed)
NAMEBEJAN, JACOBS NO.:  [id number]   MEDICAL RECORD NO.:  [id number]          PATIENT TYPE:  INP   LOCATION:  2109                         FACILITY:  [facility]   PHYSICIAN:  Odis Hollingshead, M.D.DATE OF BIRTH:  20-May-1940   DATE OF PROCEDURE:  07/23/2005  DATE OF DISCHARGE:                                 OPERATIVE REPORT   PREOPERATIVE DIAGNOSIS:  Pharyngeal laceration edema precluding placement of  a feeding tube nasoenterically.   POSTOPERATIVE DIAGNOSIS:  Pharyngeal laceration edema precluding placement  of a feeding tube nasoenterically.   PROCEDURE:  Open gastrostomy using a 24 French Foley.   SURGEON:  Odis Hollingshead, M.D.   ANESTHESIA:  General.   INDICATIONS:  This 70 year old male had cervical spine surgery that has been  complicated the hematoma with neck swelling.  He has so much pharyngeal  swelling a nasoenteric tube cannot be passed safely, and he requires long-  term access for enteral nutrition.  A gastrostomy is to be performed.   TECHNIQUE:  He was supine on the operating table.  General anesthetic had  already been administered.  The abdominal wall was sterilely prepped and  draped.  A limited upper midline incision was made through the skin,  subcutaneous tissue, fascia, and peritoneum.  The body of the stomach was  identified, and the antral body junction identified.  Two pursestring  sutures of 2-0 silk were placed in the proximal antral area.  A small  incision was made the left upper quadrant, and a 24 [place] Foley with a 10  cc balloon was then passed through the incision into the peritoneal cavity.  The balloon was checked, and it was intact.  A small gastrotomy was then  made in the middle the pursestring sutures, and the Foley was placed into  the stomach.  The two pursestring sutures were tied down, and the Foley  balloon was then inflated and drawn back against the stomach wall.  A four-  quadrant gastropexy was then  performed with interrupted 2-0 silk sutures.  The gastrostomy tube/Foley flushed easily, and gastric contents returned.   At this point, needle, sponge, and instrument counts were correct.  I then  closed the fascia with a #1 PDS suture.  Subcutaneous tissue was irrigated,  and the skin was closed with staples.  The gastrostomy tube was anchored to  the skin with 3-0 nylon suture.  He was then placed to a Foley bag for  gravity drainage.  Sterile dressings were applied.  He tolerated the  procedure without any apparent complications.  He was taken back to the  intensive care unit intubated but in stable condition.     Odis Hollingshead, M.D.  Electronically Signed    TJR/MEDQ  D:  07/25/2005  T:  07/25/2005  Job:  [id number]

## 2010-06-21 NOTE — Op Note (Signed)
Luis Wolfe, Luis Wolfe NO.:  [id number]   MEDICAL RECORD NO.:  [id number]          PATIENT TYPE:  INP   LOCATION:  2109                         FACILITY:  [facility]   PHYSICIAN:  [name], MD         DATE OF BIRTH:  Oct 12, 1940   DATE OF PROCEDURE:  08/05/2005  DATE OF DISCHARGE:                                 OPERATIVE REPORT   PREOPERATIVE DIAGNOSIS:  Obstructing laryngeal edema.   POSTOPERATIVE DIAGNOSIS:  Obstructing laryngeal edema, with markedly reduced  edema.   PROCEDURE:  Micro direct laryngoscopy with extubation.   SURGEON:  Dr. [name].   ANESTHESIA:  General endotracheal anesthesia.   ESTIMATED BLOOD LOSS:  None.   COMPLICATIONS:  None.   INDICATIONS:  The patient is a 70 year old male who underwent cervical  decompression with fusion and plating on 07/24/2005.  Postoperatively, he  suffered a left neck hematoma requiring drainage on two occasions within the  first 24 hours.  The second return to the operating room was associated with  a very difficult intubation.  The patient has been intubated since that time  to allow for resolution of significant laryngeal and epiglottic edema.  Further, the patient did have a posterior pharyngeal wall laceration which  was superficial.   FINDINGS:  The patient was noted to have a very minimal epiglottic edema and  moderate laryngeal edema with a patent airway.  There is still erythema of  the inner arytenoid region.  The posterior pharyngeal wall laceration was  healing.  The postcricoid area was with minimal edema.   PROCEDURE:  The patient was taken to the operating room and placed on the  operating room table.  He had an existing right-sided transnasal  endotracheal tube.  He was placed under general anesthesia through the  existing IV and endotracheal tube.  The table was then rotated  counterclockwise 90 degrees.  The neck was gently extended.  A Jaco  laryngoscope was then placed intraorally with  visualization of the  endolarynx.  A 0 degrees Storz-Hopkins endoscope was used to further  evaluate the endolarynx and subglottic region.  The subglottic region did  not have significant edema.  Photographs were attempted but not possible due  to malfunction of the printing machine.  At this  point, the patient was released from the general suspension.  The  endotracheal tube was removed.  The patient was able to move all four  extremities and to phonate after extubation.  The patient was then moved to  the recovery room.  There were no complications.      [name], MD  Electronically Signed     SJ/MEDQ  D:  08/05/2005  T:  08/05/2005  Job:  [id number]   cc:   Thana Farr. [name], M.D.  Fax: [phone]

## 2011-02-27 DIAGNOSIS — I10 Essential (primary) hypertension: Secondary | ICD-10-CM | POA: Diagnosis not present

## 2011-02-27 DIAGNOSIS — Z713 Dietary counseling and surveillance: Secondary | ICD-10-CM | POA: Diagnosis not present

## 2011-02-27 DIAGNOSIS — E119 Type 2 diabetes mellitus without complications: Secondary | ICD-10-CM | POA: Diagnosis not present

## 2011-03-06 DIAGNOSIS — I10 Essential (primary) hypertension: Secondary | ICD-10-CM | POA: Diagnosis not present

## 2011-03-06 DIAGNOSIS — E119 Type 2 diabetes mellitus without complications: Secondary | ICD-10-CM | POA: Diagnosis not present

## 2011-03-06 DIAGNOSIS — Z713 Dietary counseling and surveillance: Secondary | ICD-10-CM | POA: Diagnosis not present

## 2011-03-19 DIAGNOSIS — H40029 Open angle with borderline findings, high risk, unspecified eye: Secondary | ICD-10-CM | POA: Diagnosis not present

## 2011-03-19 DIAGNOSIS — H43399 Other vitreous opacities, unspecified eye: Secondary | ICD-10-CM | POA: Diagnosis not present

## 2011-03-19 DIAGNOSIS — Z961 Presence of intraocular lens: Secondary | ICD-10-CM | POA: Diagnosis not present

## 2011-03-19 DIAGNOSIS — H35379 Puckering of macula, unspecified eye: Secondary | ICD-10-CM | POA: Diagnosis not present

## 2011-04-16 ENCOUNTER — Other Ambulatory Visit: Payer: Self-pay | Admitting: Cardiology

## 2011-04-23 DIAGNOSIS — IMO0001 Reserved for inherently not codable concepts without codable children: Secondary | ICD-10-CM | POA: Diagnosis not present

## 2011-04-23 DIAGNOSIS — M109 Gout, unspecified: Secondary | ICD-10-CM | POA: Diagnosis not present

## 2011-04-23 DIAGNOSIS — E782 Mixed hyperlipidemia: Secondary | ICD-10-CM | POA: Diagnosis not present

## 2011-04-23 DIAGNOSIS — I10 Essential (primary) hypertension: Secondary | ICD-10-CM | POA: Diagnosis not present

## 2011-04-23 DIAGNOSIS — E669 Obesity, unspecified: Secondary | ICD-10-CM | POA: Diagnosis not present

## 2011-04-29 DIAGNOSIS — M109 Gout, unspecified: Secondary | ICD-10-CM | POA: Diagnosis not present

## 2011-04-29 DIAGNOSIS — E669 Obesity, unspecified: Secondary | ICD-10-CM | POA: Diagnosis not present

## 2011-04-29 DIAGNOSIS — N400 Enlarged prostate without lower urinary tract symptoms: Secondary | ICD-10-CM | POA: Diagnosis not present

## 2011-04-29 DIAGNOSIS — I10 Essential (primary) hypertension: Secondary | ICD-10-CM | POA: Diagnosis not present

## 2011-04-29 DIAGNOSIS — M1990 Unspecified osteoarthritis, unspecified site: Secondary | ICD-10-CM | POA: Diagnosis not present

## 2011-04-29 DIAGNOSIS — IMO0001 Reserved for inherently not codable concepts without codable children: Secondary | ICD-10-CM | POA: Diagnosis not present

## 2011-05-12 DIAGNOSIS — F0390 Unspecified dementia without behavioral disturbance: Secondary | ICD-10-CM | POA: Diagnosis not present

## 2011-05-12 DIAGNOSIS — R5381 Other malaise: Secondary | ICD-10-CM | POA: Diagnosis not present

## 2011-05-12 DIAGNOSIS — E538 Deficiency of other specified B group vitamins: Secondary | ICD-10-CM | POA: Diagnosis not present

## 2011-05-12 DIAGNOSIS — E669 Obesity, unspecified: Secondary | ICD-10-CM | POA: Diagnosis not present

## 2011-05-14 DIAGNOSIS — R413 Other amnesia: Secondary | ICD-10-CM | POA: Diagnosis not present

## 2011-05-14 DIAGNOSIS — G319 Degenerative disease of nervous system, unspecified: Secondary | ICD-10-CM | POA: Diagnosis not present

## 2011-05-14 DIAGNOSIS — R4182 Altered mental status, unspecified: Secondary | ICD-10-CM | POA: Diagnosis not present

## 2011-05-20 DIAGNOSIS — I6200 Nontraumatic subdural hemorrhage, unspecified: Secondary | ICD-10-CM | POA: Diagnosis not present

## 2011-05-20 DIAGNOSIS — I6789 Other cerebrovascular disease: Secondary | ICD-10-CM | POA: Diagnosis not present

## 2011-05-20 DIAGNOSIS — R413 Other amnesia: Secondary | ICD-10-CM | POA: Diagnosis not present

## 2011-05-20 DIAGNOSIS — G319 Degenerative disease of nervous system, unspecified: Secondary | ICD-10-CM | POA: Diagnosis not present

## 2011-06-04 DIAGNOSIS — R413 Other amnesia: Secondary | ICD-10-CM | POA: Diagnosis not present

## 2011-07-07 DIAGNOSIS — L570 Actinic keratosis: Secondary | ICD-10-CM | POA: Diagnosis not present

## 2011-07-07 DIAGNOSIS — D485 Neoplasm of uncertain behavior of skin: Secondary | ICD-10-CM | POA: Diagnosis not present

## 2011-07-07 DIAGNOSIS — H61009 Unspecified perichondritis of external ear, unspecified ear: Secondary | ICD-10-CM | POA: Diagnosis not present

## 2011-08-20 DIAGNOSIS — E669 Obesity, unspecified: Secondary | ICD-10-CM | POA: Diagnosis not present

## 2011-08-20 DIAGNOSIS — I10 Essential (primary) hypertension: Secondary | ICD-10-CM | POA: Diagnosis not present

## 2011-08-20 DIAGNOSIS — M1990 Unspecified osteoarthritis, unspecified site: Secondary | ICD-10-CM | POA: Diagnosis not present

## 2011-08-27 DIAGNOSIS — IMO0002 Reserved for concepts with insufficient information to code with codable children: Secondary | ICD-10-CM | POA: Diagnosis not present

## 2011-08-27 DIAGNOSIS — M109 Gout, unspecified: Secondary | ICD-10-CM | POA: Diagnosis not present

## 2011-08-27 DIAGNOSIS — N400 Enlarged prostate without lower urinary tract symptoms: Secondary | ICD-10-CM | POA: Diagnosis not present

## 2011-08-27 DIAGNOSIS — E669 Obesity, unspecified: Secondary | ICD-10-CM | POA: Diagnosis not present

## 2011-08-27 DIAGNOSIS — I10 Essential (primary) hypertension: Secondary | ICD-10-CM | POA: Diagnosis not present

## 2011-08-27 DIAGNOSIS — M1990 Unspecified osteoarthritis, unspecified site: Secondary | ICD-10-CM | POA: Diagnosis not present

## 2011-08-27 DIAGNOSIS — IMO0001 Reserved for inherently not codable concepts without codable children: Secondary | ICD-10-CM | POA: Diagnosis not present

## 2011-09-02 DIAGNOSIS — M2010 Hallux valgus (acquired), unspecified foot: Secondary | ICD-10-CM | POA: Diagnosis not present

## 2011-09-02 DIAGNOSIS — M79609 Pain in unspecified limb: Secondary | ICD-10-CM | POA: Diagnosis not present

## 2011-09-18 DIAGNOSIS — H4060X0 Glaucoma secondary to drugs, unspecified eye, stage unspecified: Secondary | ICD-10-CM | POA: Diagnosis not present

## 2011-09-18 DIAGNOSIS — H5710 Ocular pain, unspecified eye: Secondary | ICD-10-CM | POA: Diagnosis not present

## 2011-09-18 DIAGNOSIS — T380X5A Adverse effect of glucocorticoids and synthetic analogues, initial encounter: Secondary | ICD-10-CM | POA: Diagnosis not present

## 2011-09-18 DIAGNOSIS — H40029 Open angle with borderline findings, high risk, unspecified eye: Secondary | ICD-10-CM | POA: Diagnosis not present

## 2011-10-08 DIAGNOSIS — H01009 Unspecified blepharitis unspecified eye, unspecified eyelid: Secondary | ICD-10-CM | POA: Diagnosis not present

## 2011-10-08 DIAGNOSIS — H1045 Other chronic allergic conjunctivitis: Secondary | ICD-10-CM | POA: Diagnosis not present

## 2011-10-08 DIAGNOSIS — H02849 Edema of unspecified eye, unspecified eyelid: Secondary | ICD-10-CM | POA: Diagnosis not present

## 2011-10-08 DIAGNOSIS — H01119 Allergic dermatitis of unspecified eye, unspecified eyelid: Secondary | ICD-10-CM | POA: Diagnosis not present

## 2012-01-02 DIAGNOSIS — IMO0001 Reserved for inherently not codable concepts without codable children: Secondary | ICD-10-CM | POA: Diagnosis not present

## 2012-01-02 DIAGNOSIS — I10 Essential (primary) hypertension: Secondary | ICD-10-CM | POA: Diagnosis not present

## 2012-01-02 DIAGNOSIS — M109 Gout, unspecified: Secondary | ICD-10-CM | POA: Diagnosis not present

## 2012-01-02 DIAGNOSIS — IMO0002 Reserved for concepts with insufficient information to code with codable children: Secondary | ICD-10-CM | POA: Diagnosis not present

## 2012-01-02 DIAGNOSIS — N400 Enlarged prostate without lower urinary tract symptoms: Secondary | ICD-10-CM | POA: Diagnosis not present

## 2012-01-02 DIAGNOSIS — M1990 Unspecified osteoarthritis, unspecified site: Secondary | ICD-10-CM | POA: Diagnosis not present

## 2012-01-02 DIAGNOSIS — E669 Obesity, unspecified: Secondary | ICD-10-CM | POA: Diagnosis not present

## 2012-01-08 DIAGNOSIS — Z23 Encounter for immunization: Secondary | ICD-10-CM | POA: Diagnosis not present

## 2012-03-04 DIAGNOSIS — F411 Generalized anxiety disorder: Secondary | ICD-10-CM | POA: Diagnosis present

## 2012-03-04 DIAGNOSIS — K8020 Calculus of gallbladder without cholecystitis without obstruction: Secondary | ICD-10-CM | POA: Diagnosis not present

## 2012-03-04 DIAGNOSIS — R1084 Generalized abdominal pain: Secondary | ICD-10-CM | POA: Diagnosis not present

## 2012-03-04 DIAGNOSIS — K828 Other specified diseases of gallbladder: Secondary | ICD-10-CM | POA: Diagnosis not present

## 2012-03-04 DIAGNOSIS — Z79899 Other long term (current) drug therapy: Secondary | ICD-10-CM | POA: Diagnosis not present

## 2012-03-04 DIAGNOSIS — E876 Hypokalemia: Secondary | ICD-10-CM | POA: Diagnosis present

## 2012-03-04 DIAGNOSIS — Z9861 Coronary angioplasty status: Secondary | ICD-10-CM | POA: Diagnosis not present

## 2012-03-04 DIAGNOSIS — I2510 Atherosclerotic heart disease of native coronary artery without angina pectoris: Secondary | ICD-10-CM | POA: Diagnosis not present

## 2012-03-04 DIAGNOSIS — N400 Enlarged prostate without lower urinary tract symptoms: Secondary | ICD-10-CM | POA: Diagnosis present

## 2012-03-04 DIAGNOSIS — K219 Gastro-esophageal reflux disease without esophagitis: Secondary | ICD-10-CM | POA: Diagnosis not present

## 2012-03-04 DIAGNOSIS — E785 Hyperlipidemia, unspecified: Secondary | ICD-10-CM | POA: Diagnosis present

## 2012-03-04 DIAGNOSIS — Z6831 Body mass index (BMI) 31.0-31.9, adult: Secondary | ICD-10-CM | POA: Diagnosis not present

## 2012-03-04 DIAGNOSIS — F329 Major depressive disorder, single episode, unspecified: Secondary | ICD-10-CM | POA: Diagnosis present

## 2012-03-04 DIAGNOSIS — F3289 Other specified depressive episodes: Secondary | ICD-10-CM | POA: Diagnosis not present

## 2012-03-04 DIAGNOSIS — K810 Acute cholecystitis: Secondary | ICD-10-CM | POA: Diagnosis not present

## 2012-03-04 DIAGNOSIS — K5730 Diverticulosis of large intestine without perforation or abscess without bleeding: Secondary | ICD-10-CM | POA: Diagnosis not present

## 2012-03-04 DIAGNOSIS — K8000 Calculus of gallbladder with acute cholecystitis without obstruction: Secondary | ICD-10-CM | POA: Diagnosis not present

## 2012-03-04 DIAGNOSIS — Z7982 Long term (current) use of aspirin: Secondary | ICD-10-CM | POA: Diagnosis not present

## 2012-03-04 DIAGNOSIS — M109 Gout, unspecified: Secondary | ICD-10-CM | POA: Diagnosis not present

## 2012-03-04 DIAGNOSIS — F0280 Dementia in other diseases classified elsewhere without behavioral disturbance: Secondary | ICD-10-CM | POA: Diagnosis present

## 2012-03-04 DIAGNOSIS — K8066 Calculus of gallbladder and bile duct with acute and chronic cholecystitis without obstruction: Secondary | ICD-10-CM | POA: Diagnosis not present

## 2012-03-04 DIAGNOSIS — Z8249 Family history of ischemic heart disease and other diseases of the circulatory system: Secondary | ICD-10-CM | POA: Diagnosis not present

## 2012-03-04 DIAGNOSIS — Z781 Physical restraint status: Secondary | ICD-10-CM | POA: Diagnosis not present

## 2012-03-04 DIAGNOSIS — K819 Cholecystitis, unspecified: Secondary | ICD-10-CM | POA: Diagnosis not present

## 2012-03-04 DIAGNOSIS — Z833 Family history of diabetes mellitus: Secondary | ICD-10-CM | POA: Diagnosis not present

## 2012-03-04 DIAGNOSIS — Z7902 Long term (current) use of antithrombotics/antiplatelets: Secondary | ICD-10-CM | POA: Diagnosis not present

## 2012-03-04 DIAGNOSIS — Z886 Allergy status to analgesic agent status: Secondary | ICD-10-CM | POA: Diagnosis not present

## 2012-03-04 DIAGNOSIS — I10 Essential (primary) hypertension: Secondary | ICD-10-CM | POA: Diagnosis not present

## 2012-03-09 DIAGNOSIS — I10 Essential (primary) hypertension: Secondary | ICD-10-CM | POA: Diagnosis not present

## 2012-03-09 DIAGNOSIS — Z79899 Other long term (current) drug therapy: Secondary | ICD-10-CM | POA: Diagnosis not present

## 2012-03-09 DIAGNOSIS — E782 Mixed hyperlipidemia: Secondary | ICD-10-CM | POA: Diagnosis not present

## 2012-03-16 DIAGNOSIS — R7989 Other specified abnormal findings of blood chemistry: Secondary | ICD-10-CM | POA: Diagnosis not present

## 2012-03-16 DIAGNOSIS — Z9089 Acquired absence of other organs: Secondary | ICD-10-CM | POA: Diagnosis not present

## 2012-03-16 DIAGNOSIS — R188 Other ascites: Secondary | ICD-10-CM | POA: Diagnosis not present

## 2012-03-22 DIAGNOSIS — R55 Syncope and collapse: Secondary | ICD-10-CM | POA: Diagnosis not present

## 2012-03-22 DIAGNOSIS — I10 Essential (primary) hypertension: Secondary | ICD-10-CM | POA: Diagnosis not present

## 2012-03-30 DIAGNOSIS — E782 Mixed hyperlipidemia: Secondary | ICD-10-CM | POA: Diagnosis not present

## 2012-06-14 DIAGNOSIS — H40049 Steroid responder, unspecified eye: Secondary | ICD-10-CM | POA: Diagnosis not present

## 2012-06-14 DIAGNOSIS — H35379 Puckering of macula, unspecified eye: Secondary | ICD-10-CM | POA: Diagnosis not present

## 2012-06-14 DIAGNOSIS — H40029 Open angle with borderline findings, high risk, unspecified eye: Secondary | ICD-10-CM | POA: Diagnosis not present

## 2012-06-14 DIAGNOSIS — H43399 Other vitreous opacities, unspecified eye: Secondary | ICD-10-CM | POA: Diagnosis not present

## 2012-08-09 DIAGNOSIS — E782 Mixed hyperlipidemia: Secondary | ICD-10-CM | POA: Diagnosis not present

## 2012-08-09 DIAGNOSIS — I10 Essential (primary) hypertension: Secondary | ICD-10-CM | POA: Diagnosis not present

## 2012-08-16 DIAGNOSIS — IMO0002 Reserved for concepts with insufficient information to code with codable children: Secondary | ICD-10-CM | POA: Diagnosis not present

## 2012-08-16 DIAGNOSIS — IMO0001 Reserved for inherently not codable concepts without codable children: Secondary | ICD-10-CM | POA: Diagnosis not present

## 2012-08-16 DIAGNOSIS — N400 Enlarged prostate without lower urinary tract symptoms: Secondary | ICD-10-CM | POA: Diagnosis not present

## 2012-08-16 DIAGNOSIS — M1990 Unspecified osteoarthritis, unspecified site: Secondary | ICD-10-CM | POA: Diagnosis not present

## 2012-08-16 DIAGNOSIS — E669 Obesity, unspecified: Secondary | ICD-10-CM | POA: Diagnosis not present

## 2012-08-16 DIAGNOSIS — I10 Essential (primary) hypertension: Secondary | ICD-10-CM | POA: Diagnosis not present

## 2012-08-16 DIAGNOSIS — M109 Gout, unspecified: Secondary | ICD-10-CM | POA: Diagnosis not present

## 2012-09-20 DIAGNOSIS — L570 Actinic keratosis: Secondary | ICD-10-CM | POA: Diagnosis not present

## 2012-09-20 DIAGNOSIS — L821 Other seborrheic keratosis: Secondary | ICD-10-CM | POA: Diagnosis not present

## 2012-09-20 DIAGNOSIS — D485 Neoplasm of uncertain behavior of skin: Secondary | ICD-10-CM | POA: Diagnosis not present

## 2012-09-28 DIAGNOSIS — Z961 Presence of intraocular lens: Secondary | ICD-10-CM | POA: Diagnosis not present

## 2012-09-28 DIAGNOSIS — H31019 Macula scars of posterior pole (postinflammatory) (post-traumatic), unspecified eye: Secondary | ICD-10-CM | POA: Diagnosis not present

## 2012-09-28 DIAGNOSIS — H35379 Puckering of macula, unspecified eye: Secondary | ICD-10-CM | POA: Diagnosis not present

## 2012-09-28 DIAGNOSIS — H11159 Pinguecula, unspecified eye: Secondary | ICD-10-CM | POA: Diagnosis not present

## 2012-09-28 DIAGNOSIS — H40019 Open angle with borderline findings, low risk, unspecified eye: Secondary | ICD-10-CM | POA: Diagnosis not present

## 2012-10-15 DIAGNOSIS — S139XXA Sprain of joints and ligaments of unspecified parts of neck, initial encounter: Secondary | ICD-10-CM | POA: Diagnosis not present

## 2012-10-15 DIAGNOSIS — Z23 Encounter for immunization: Secondary | ICD-10-CM | POA: Diagnosis not present

## 2012-10-15 DIAGNOSIS — H00019 Hordeolum externum unspecified eye, unspecified eyelid: Secondary | ICD-10-CM | POA: Diagnosis not present

## 2013-01-03 DIAGNOSIS — I10 Essential (primary) hypertension: Secondary | ICD-10-CM | POA: Diagnosis not present

## 2013-01-03 DIAGNOSIS — IMO0002 Reserved for concepts with insufficient information to code with codable children: Secondary | ICD-10-CM | POA: Diagnosis not present

## 2013-01-03 DIAGNOSIS — E669 Obesity, unspecified: Secondary | ICD-10-CM | POA: Diagnosis not present

## 2013-01-03 DIAGNOSIS — E782 Mixed hyperlipidemia: Secondary | ICD-10-CM | POA: Diagnosis not present

## 2013-01-05 DIAGNOSIS — N400 Enlarged prostate without lower urinary tract symptoms: Secondary | ICD-10-CM | POA: Diagnosis not present

## 2013-01-05 DIAGNOSIS — I10 Essential (primary) hypertension: Secondary | ICD-10-CM | POA: Diagnosis not present

## 2013-01-05 DIAGNOSIS — M1990 Unspecified osteoarthritis, unspecified site: Secondary | ICD-10-CM | POA: Diagnosis not present

## 2013-01-05 DIAGNOSIS — E669 Obesity, unspecified: Secondary | ICD-10-CM | POA: Diagnosis not present

## 2013-01-05 DIAGNOSIS — IMO0002 Reserved for concepts with insufficient information to code with codable children: Secondary | ICD-10-CM | POA: Diagnosis not present

## 2013-01-05 DIAGNOSIS — M109 Gout, unspecified: Secondary | ICD-10-CM | POA: Diagnosis not present

## 2013-01-05 DIAGNOSIS — IMO0001 Reserved for inherently not codable concepts without codable children: Secondary | ICD-10-CM | POA: Diagnosis not present

## 2013-05-05 DIAGNOSIS — IMO0001 Reserved for inherently not codable concepts without codable children: Secondary | ICD-10-CM | POA: Diagnosis not present

## 2013-05-05 DIAGNOSIS — E782 Mixed hyperlipidemia: Secondary | ICD-10-CM | POA: Diagnosis not present

## 2013-05-05 DIAGNOSIS — E669 Obesity, unspecified: Secondary | ICD-10-CM | POA: Diagnosis not present

## 2013-05-05 DIAGNOSIS — K2100 Gastro-esophageal reflux disease with esophagitis, without bleeding: Secondary | ICD-10-CM | POA: Diagnosis not present

## 2013-05-05 DIAGNOSIS — E119 Type 2 diabetes mellitus without complications: Secondary | ICD-10-CM | POA: Diagnosis not present

## 2013-05-05 DIAGNOSIS — I10 Essential (primary) hypertension: Secondary | ICD-10-CM | POA: Diagnosis not present

## 2013-05-12 DIAGNOSIS — IMO0001 Reserved for inherently not codable concepts without codable children: Secondary | ICD-10-CM | POA: Diagnosis not present

## 2013-05-12 DIAGNOSIS — I10 Essential (primary) hypertension: Secondary | ICD-10-CM | POA: Diagnosis not present

## 2013-05-12 DIAGNOSIS — E669 Obesity, unspecified: Secondary | ICD-10-CM | POA: Diagnosis not present

## 2013-05-12 DIAGNOSIS — M1990 Unspecified osteoarthritis, unspecified site: Secondary | ICD-10-CM | POA: Diagnosis not present

## 2013-05-12 DIAGNOSIS — IMO0002 Reserved for concepts with insufficient information to code with codable children: Secondary | ICD-10-CM | POA: Diagnosis not present

## 2013-05-12 DIAGNOSIS — E119 Type 2 diabetes mellitus without complications: Secondary | ICD-10-CM | POA: Diagnosis not present

## 2013-05-12 DIAGNOSIS — N400 Enlarged prostate without lower urinary tract symptoms: Secondary | ICD-10-CM | POA: Diagnosis not present

## 2013-05-12 DIAGNOSIS — M109 Gout, unspecified: Secondary | ICD-10-CM | POA: Diagnosis not present

## 2013-06-20 DIAGNOSIS — T380X5A Adverse effect of glucocorticoids and synthetic analogues, initial encounter: Secondary | ICD-10-CM | POA: Diagnosis not present

## 2013-06-20 DIAGNOSIS — H524 Presbyopia: Secondary | ICD-10-CM | POA: Diagnosis not present

## 2013-06-20 DIAGNOSIS — H35039 Hypertensive retinopathy, unspecified eye: Secondary | ICD-10-CM | POA: Diagnosis not present

## 2013-06-20 DIAGNOSIS — H4060X0 Glaucoma secondary to drugs, unspecified eye, stage unspecified: Secondary | ICD-10-CM | POA: Diagnosis not present

## 2013-06-20 DIAGNOSIS — H40019 Open angle with borderline findings, low risk, unspecified eye: Secondary | ICD-10-CM | POA: Diagnosis not present

## 2013-09-07 DIAGNOSIS — N183 Chronic kidney disease, stage 3 unspecified: Secondary | ICD-10-CM | POA: Diagnosis not present

## 2013-09-07 DIAGNOSIS — K2100 Gastro-esophageal reflux disease with esophagitis, without bleeding: Secondary | ICD-10-CM | POA: Diagnosis not present

## 2013-09-07 DIAGNOSIS — E119 Type 2 diabetes mellitus without complications: Secondary | ICD-10-CM | POA: Diagnosis not present

## 2013-09-07 DIAGNOSIS — E782 Mixed hyperlipidemia: Secondary | ICD-10-CM | POA: Diagnosis not present

## 2013-09-07 DIAGNOSIS — I10 Essential (primary) hypertension: Secondary | ICD-10-CM | POA: Diagnosis not present

## 2013-09-14 DIAGNOSIS — Z0000 Encounter for general adult medical examination without abnormal findings: Secondary | ICD-10-CM | POA: Diagnosis not present

## 2013-09-14 DIAGNOSIS — M1990 Unspecified osteoarthritis, unspecified site: Secondary | ICD-10-CM | POA: Diagnosis not present

## 2013-09-14 DIAGNOSIS — Z1331 Encounter for screening for depression: Secondary | ICD-10-CM | POA: Diagnosis not present

## 2013-09-14 DIAGNOSIS — IMO0001 Reserved for inherently not codable concepts without codable children: Secondary | ICD-10-CM | POA: Diagnosis not present

## 2013-09-14 DIAGNOSIS — E669 Obesity, unspecified: Secondary | ICD-10-CM | POA: Diagnosis not present

## 2013-09-14 DIAGNOSIS — I10 Essential (primary) hypertension: Secondary | ICD-10-CM | POA: Diagnosis not present

## 2013-09-14 DIAGNOSIS — M109 Gout, unspecified: Secondary | ICD-10-CM | POA: Diagnosis not present

## 2013-09-14 DIAGNOSIS — N400 Enlarged prostate without lower urinary tract symptoms: Secondary | ICD-10-CM | POA: Diagnosis not present

## 2013-10-12 DIAGNOSIS — L259 Unspecified contact dermatitis, unspecified cause: Secondary | ICD-10-CM | POA: Diagnosis not present

## 2013-10-12 DIAGNOSIS — L570 Actinic keratosis: Secondary | ICD-10-CM | POA: Diagnosis not present

## 2013-11-03 DIAGNOSIS — H40021 Open angle with borderline findings, high risk, right eye: Secondary | ICD-10-CM | POA: Diagnosis not present

## 2013-11-03 HISTORY — PX: CARDIAC VALVE SURGERY: SHX40

## 2013-11-28 DIAGNOSIS — R079 Chest pain, unspecified: Secondary | ICD-10-CM | POA: Diagnosis not present

## 2013-11-28 DIAGNOSIS — I209 Angina pectoris, unspecified: Secondary | ICD-10-CM | POA: Diagnosis not present

## 2013-11-29 DIAGNOSIS — R0602 Shortness of breath: Secondary | ICD-10-CM | POA: Diagnosis not present

## 2013-11-29 DIAGNOSIS — R413 Other amnesia: Secondary | ICD-10-CM | POA: Diagnosis not present

## 2013-11-29 DIAGNOSIS — Z955 Presence of coronary angioplasty implant and graft: Secondary | ICD-10-CM | POA: Diagnosis not present

## 2013-11-29 DIAGNOSIS — I519 Heart disease, unspecified: Secondary | ICD-10-CM | POA: Diagnosis not present

## 2013-11-29 DIAGNOSIS — I340 Nonrheumatic mitral (valve) insufficiency: Secondary | ICD-10-CM | POA: Diagnosis not present

## 2013-11-29 DIAGNOSIS — Z7902 Long term (current) use of antithrombotics/antiplatelets: Secondary | ICD-10-CM | POA: Diagnosis not present

## 2013-11-29 DIAGNOSIS — R079 Chest pain, unspecified: Secondary | ICD-10-CM | POA: Diagnosis not present

## 2013-11-29 DIAGNOSIS — E669 Obesity, unspecified: Secondary | ICD-10-CM | POA: Diagnosis not present

## 2013-11-29 DIAGNOSIS — I200 Unstable angina: Secondary | ICD-10-CM | POA: Diagnosis not present

## 2013-11-29 DIAGNOSIS — Z79899 Other long term (current) drug therapy: Secondary | ICD-10-CM | POA: Diagnosis not present

## 2013-11-29 DIAGNOSIS — I517 Cardiomegaly: Secondary | ICD-10-CM | POA: Diagnosis not present

## 2013-11-29 DIAGNOSIS — R918 Other nonspecific abnormal finding of lung field: Secondary | ICD-10-CM | POA: Diagnosis not present

## 2013-11-29 DIAGNOSIS — Z743 Need for continuous supervision: Secondary | ICD-10-CM | POA: Diagnosis not present

## 2013-11-29 DIAGNOSIS — G4700 Insomnia, unspecified: Secondary | ICD-10-CM | POA: Diagnosis present

## 2013-11-29 DIAGNOSIS — K219 Gastro-esophageal reflux disease without esophagitis: Secondary | ICD-10-CM | POA: Diagnosis not present

## 2013-11-29 DIAGNOSIS — J90 Pleural effusion, not elsewhere classified: Secondary | ICD-10-CM | POA: Diagnosis not present

## 2013-11-29 DIAGNOSIS — Z7982 Long term (current) use of aspirin: Secondary | ICD-10-CM | POA: Diagnosis not present

## 2013-11-29 DIAGNOSIS — I2510 Atherosclerotic heart disease of native coronary artery without angina pectoris: Secondary | ICD-10-CM | POA: Diagnosis not present

## 2013-11-29 DIAGNOSIS — R001 Bradycardia, unspecified: Secondary | ICD-10-CM | POA: Diagnosis not present

## 2013-11-29 DIAGNOSIS — J9811 Atelectasis: Secondary | ICD-10-CM | POA: Diagnosis not present

## 2013-11-29 DIAGNOSIS — E785 Hyperlipidemia, unspecified: Secondary | ICD-10-CM | POA: Diagnosis present

## 2013-11-29 DIAGNOSIS — I209 Angina pectoris, unspecified: Secondary | ICD-10-CM | POA: Diagnosis not present

## 2013-11-29 DIAGNOSIS — F329 Major depressive disorder, single episode, unspecified: Secondary | ICD-10-CM | POA: Diagnosis present

## 2013-11-29 DIAGNOSIS — N400 Enlarged prostate without lower urinary tract symptoms: Secondary | ICD-10-CM | POA: Diagnosis present

## 2013-11-29 DIAGNOSIS — I25110 Atherosclerotic heart disease of native coronary artery with unstable angina pectoris: Secondary | ICD-10-CM | POA: Diagnosis not present

## 2013-11-29 DIAGNOSIS — Z6832 Body mass index (BMI) 32.0-32.9, adult: Secondary | ICD-10-CM | POA: Diagnosis not present

## 2013-11-29 DIAGNOSIS — I10 Essential (primary) hypertension: Secondary | ICD-10-CM | POA: Diagnosis not present

## 2013-11-29 DIAGNOSIS — I498 Other specified cardiac arrhythmias: Secondary | ICD-10-CM | POA: Diagnosis not present

## 2013-11-29 DIAGNOSIS — Z886 Allergy status to analgesic agent status: Secondary | ICD-10-CM | POA: Diagnosis not present

## 2013-12-05 DIAGNOSIS — R0602 Shortness of breath: Secondary | ICD-10-CM | POA: Diagnosis not present

## 2013-12-15 DIAGNOSIS — M1000 Idiopathic gout, unspecified site: Secondary | ICD-10-CM | POA: Diagnosis not present

## 2013-12-28 DIAGNOSIS — Z951 Presence of aortocoronary bypass graft: Secondary | ICD-10-CM | POA: Diagnosis not present

## 2013-12-28 DIAGNOSIS — R001 Bradycardia, unspecified: Secondary | ICD-10-CM | POA: Diagnosis not present

## 2013-12-28 DIAGNOSIS — Z981 Arthrodesis status: Secondary | ICD-10-CM | POA: Diagnosis not present

## 2013-12-28 DIAGNOSIS — I2510 Atherosclerotic heart disease of native coronary artery without angina pectoris: Secondary | ICD-10-CM | POA: Diagnosis not present

## 2014-01-05 ENCOUNTER — Encounter (HOSPITAL_COMMUNITY): Payer: Self-pay

## 2014-01-05 ENCOUNTER — Encounter (HOSPITAL_COMMUNITY)
Admission: RE | Admit: 2014-01-05 | Discharge: 2014-01-05 | Disposition: A | Discharge status: Home / Self Care | Payer: Medicare Other | Source: Ambulatory Visit | Attending: Cardiovascular Disease | Admitting: Cardiovascular Disease

## 2014-01-05 VITALS — BP 98/62 | HR 53 | Ht 68.0 in | Wt 208.6 lb

## 2014-01-05 DIAGNOSIS — I2510 Atherosclerotic heart disease of native coronary artery without angina pectoris: Secondary | ICD-10-CM | POA: Insufficient documentation

## 2014-01-05 DIAGNOSIS — Z951 Presence of aortocoronary bypass graft: Secondary | ICD-10-CM | POA: Insufficient documentation

## 2014-01-05 NOTE — Progress Notes (Signed)
Patient referred to Cardiac Rehab by Dr. Hamilton Capri due to CABG x 4.  Dr. Hamilton Capri is his cardiologist and Dr. Quillian Quince is his PCP.  During orientation advised patient on arrival and appointment times what to wear, what to do before, during and after exercise.  Reviewed attendance and class policy.  Talked about inclement weather and class consultation policy. Patient is scheduled to start cardiac Rehab on 12/14/15at 0930.  Patient was advised to come to class 15 minutes before class starts.  He was also given instructions on meeting with the dietician and attending the Family Structure classes. Pt is eager to get started.  Pt was able to complete 6 min walk test.

## 2014-01-05 NOTE — Patient Instructions (Signed)
Pt has finished orientation and is scheduled to start CR on 01/16/14 at 0930am. Pt has been instructed to arrive to class 15 minutes early for scheduled class. Pt has been instructed to wear comfortable clothing and shoes with rubber soles. Pt has been told to take their medications 1 hour prior to coming to class.  If the patient is not going to attend class, he/she has been instructed to call.

## 2014-01-09 DIAGNOSIS — E119 Type 2 diabetes mellitus without complications: Secondary | ICD-10-CM | POA: Diagnosis not present

## 2014-01-09 DIAGNOSIS — I10 Essential (primary) hypertension: Secondary | ICD-10-CM | POA: Diagnosis not present

## 2014-01-09 DIAGNOSIS — E782 Mixed hyperlipidemia: Secondary | ICD-10-CM | POA: Diagnosis not present

## 2014-01-09 DIAGNOSIS — N183 Chronic kidney disease, stage 3 (moderate): Secondary | ICD-10-CM | POA: Diagnosis not present

## 2014-01-16 ENCOUNTER — Encounter (HOSPITAL_COMMUNITY)
Admission: RE | Admit: 2014-01-16 | Discharge: 2014-01-16 | Disposition: A | Discharge status: Home / Self Care | Payer: Medicare Other | Source: Ambulatory Visit | Attending: Cardiovascular Disease | Admitting: Cardiovascular Disease

## 2014-01-16 DIAGNOSIS — Z951 Presence of aortocoronary bypass graft: Secondary | ICD-10-CM | POA: Diagnosis not present

## 2014-01-16 DIAGNOSIS — I2510 Atherosclerotic heart disease of native coronary artery without angina pectoris: Secondary | ICD-10-CM | POA: Diagnosis not present

## 2014-01-18 ENCOUNTER — Encounter (HOSPITAL_COMMUNITY)
Admission: RE | Admit: 2014-01-18 | Discharge: 2014-01-18 | Disposition: A | Discharge status: Home / Self Care | Payer: Medicare Other | Source: Ambulatory Visit | Attending: Cardiovascular Disease | Admitting: Cardiovascular Disease

## 2014-01-18 DIAGNOSIS — Z951 Presence of aortocoronary bypass graft: Secondary | ICD-10-CM | POA: Diagnosis not present

## 2014-01-20 ENCOUNTER — Encounter (HOSPITAL_COMMUNITY)
Admission: RE | Admit: 2014-01-20 | Discharge: 2014-01-20 | Disposition: A | Discharge status: Home / Self Care | Payer: Medicare Other | Source: Ambulatory Visit | Attending: Cardiovascular Disease | Admitting: Cardiovascular Disease

## 2014-01-20 DIAGNOSIS — Z951 Presence of aortocoronary bypass graft: Secondary | ICD-10-CM | POA: Diagnosis not present

## 2014-01-20 NOTE — Progress Notes (Signed)
Cardiac Rehabilitation Program Outcomes Report   Orientation:  01/05/14 Graduate Date:  tbd Discharge Date:  tbd # of sessions completed: 3  Cardiologist: [place] Family MD:  Prudy Feeler Time:  0930  A.  Exercise Program:  Tolerates exercise @ 3.64 METS for 30 minutes  B.  Mental Health:  Good mental attitude  C.  Education/Instruction/Skills  Accurately checks own pulse.  Rest:  58  Exercise: 74, Knows THR for exercise and Uses Perceived Exertion Scale and/or Dyspnea Scale    D.  Nutrition/Weight Control/Body Composition:  Adherence to prescribed nutrition program: good    E.  Blood Lipids   No results found for: CHOL, HDL, LDLCALC, LDLDIRECT, TRIG, CHOLHDL  F.  Lifestyle Changes:  Making positive lifestyle changes  G.  Symptoms noted with exercise:  Asymptomatic  Report Completed By:  Felicity Coyer, RN   Comments:  First week note.

## 2014-01-23 ENCOUNTER — Encounter (HOSPITAL_COMMUNITY)
Admission: RE | Admit: 2014-01-23 | Discharge: 2014-01-23 | Disposition: A | Discharge status: Home / Self Care | Payer: Medicare Other | Source: Ambulatory Visit | Attending: Cardiovascular Disease | Admitting: Cardiovascular Disease

## 2014-01-23 DIAGNOSIS — Z951 Presence of aortocoronary bypass graft: Secondary | ICD-10-CM | POA: Diagnosis not present

## 2014-01-24 DIAGNOSIS — E6609 Other obesity due to excess calories: Secondary | ICD-10-CM | POA: Diagnosis not present

## 2014-01-24 DIAGNOSIS — J301 Allergic rhinitis due to pollen: Secondary | ICD-10-CM | POA: Diagnosis not present

## 2014-01-24 DIAGNOSIS — F324 Major depressive disorder, single episode, in partial remission: Secondary | ICD-10-CM | POA: Diagnosis not present

## 2014-01-24 DIAGNOSIS — E782 Mixed hyperlipidemia: Secondary | ICD-10-CM | POA: Diagnosis not present

## 2014-01-24 DIAGNOSIS — I2510 Atherosclerotic heart disease of native coronary artery without angina pectoris: Secondary | ICD-10-CM | POA: Diagnosis not present

## 2014-01-24 DIAGNOSIS — Z951 Presence of aortocoronary bypass graft: Secondary | ICD-10-CM | POA: Diagnosis not present

## 2014-01-24 DIAGNOSIS — K210 Gastro-esophageal reflux disease with esophagitis: Secondary | ICD-10-CM | POA: Diagnosis not present

## 2014-01-24 DIAGNOSIS — Z23 Encounter for immunization: Secondary | ICD-10-CM | POA: Diagnosis not present

## 2014-01-24 DIAGNOSIS — E119 Type 2 diabetes mellitus without complications: Secondary | ICD-10-CM | POA: Diagnosis not present

## 2014-01-24 DIAGNOSIS — I10 Essential (primary) hypertension: Secondary | ICD-10-CM | POA: Diagnosis not present

## 2014-01-24 DIAGNOSIS — M1000 Idiopathic gout, unspecified site: Secondary | ICD-10-CM | POA: Diagnosis not present

## 2014-01-25 ENCOUNTER — Encounter (HOSPITAL_COMMUNITY)
Admission: RE | Admit: 2014-01-25 | Discharge: 2014-01-25 | Disposition: A | Discharge status: Home / Self Care | Payer: Medicare Other | Source: Ambulatory Visit | Attending: Cardiovascular Disease | Admitting: Cardiovascular Disease

## 2014-01-25 DIAGNOSIS — Z951 Presence of aortocoronary bypass graft: Secondary | ICD-10-CM | POA: Diagnosis not present

## 2014-01-27 ENCOUNTER — Encounter (HOSPITAL_COMMUNITY): Payer: Medicare Other

## 2014-01-30 ENCOUNTER — Encounter (HOSPITAL_COMMUNITY)
Admission: RE | Admit: 2014-01-30 | Discharge: 2014-01-30 | Disposition: A | Discharge status: Home / Self Care | Payer: Medicare Other | Source: Ambulatory Visit | Attending: Cardiovascular Disease | Admitting: Cardiovascular Disease

## 2014-01-30 DIAGNOSIS — Z951 Presence of aortocoronary bypass graft: Secondary | ICD-10-CM | POA: Diagnosis not present

## 2014-02-01 ENCOUNTER — Encounter (HOSPITAL_COMMUNITY)
Admission: RE | Admit: 2014-02-01 | Discharge: 2014-02-01 | Disposition: A | Discharge status: Home / Self Care | Payer: Medicare Other | Source: Ambulatory Visit | Attending: Cardiovascular Disease | Admitting: Cardiovascular Disease

## 2014-02-01 DIAGNOSIS — Z951 Presence of aortocoronary bypass graft: Secondary | ICD-10-CM | POA: Diagnosis not present

## 2014-02-03 ENCOUNTER — Encounter (HOSPITAL_COMMUNITY): Payer: Medicare Other

## 2014-02-06 ENCOUNTER — Encounter (HOSPITAL_COMMUNITY)
Admission: RE | Admit: 2014-02-06 | Discharge: 2014-02-06 | Disposition: A | Discharge status: Home / Self Care | Payer: Medicare Other | Source: Ambulatory Visit | Attending: Cardiovascular Disease | Admitting: Cardiovascular Disease

## 2014-02-06 DIAGNOSIS — I2510 Atherosclerotic heart disease of native coronary artery without angina pectoris: Secondary | ICD-10-CM | POA: Insufficient documentation

## 2014-02-06 DIAGNOSIS — Z951 Presence of aortocoronary bypass graft: Secondary | ICD-10-CM | POA: Insufficient documentation

## 2014-02-08 ENCOUNTER — Encounter (HOSPITAL_COMMUNITY)
Admission: RE | Admit: 2014-02-08 | Discharge: 2014-02-08 | Disposition: A | Discharge status: Home / Self Care | Payer: Medicare Other | Source: Ambulatory Visit | Attending: Cardiovascular Disease | Admitting: Cardiovascular Disease

## 2014-02-08 DIAGNOSIS — I2510 Atherosclerotic heart disease of native coronary artery without angina pectoris: Secondary | ICD-10-CM | POA: Diagnosis not present

## 2014-02-08 DIAGNOSIS — Z951 Presence of aortocoronary bypass graft: Secondary | ICD-10-CM | POA: Diagnosis not present

## 2014-02-10 ENCOUNTER — Encounter (HOSPITAL_COMMUNITY)
Admission: RE | Admit: 2014-02-10 | Discharge: 2014-02-10 | Disposition: A | Discharge status: Home / Self Care | Payer: Medicare Other | Source: Ambulatory Visit | Attending: Cardiovascular Disease | Admitting: Cardiovascular Disease

## 2014-02-10 DIAGNOSIS — Z951 Presence of aortocoronary bypass graft: Secondary | ICD-10-CM | POA: Diagnosis not present

## 2014-02-10 DIAGNOSIS — I2510 Atherosclerotic heart disease of native coronary artery without angina pectoris: Secondary | ICD-10-CM | POA: Diagnosis not present

## 2014-02-13 ENCOUNTER — Encounter (HOSPITAL_COMMUNITY)
Admission: RE | Admit: 2014-02-13 | Discharge: 2014-02-13 | Disposition: A | Discharge status: Home / Self Care | Payer: Medicare Other | Source: Ambulatory Visit | Attending: Cardiovascular Disease | Admitting: Cardiovascular Disease

## 2014-02-13 DIAGNOSIS — Z951 Presence of aortocoronary bypass graft: Secondary | ICD-10-CM | POA: Diagnosis not present

## 2014-02-13 DIAGNOSIS — I2510 Atherosclerotic heart disease of native coronary artery without angina pectoris: Secondary | ICD-10-CM | POA: Diagnosis not present

## 2014-02-15 ENCOUNTER — Encounter (HOSPITAL_COMMUNITY)
Admission: RE | Admit: 2014-02-15 | Discharge: 2014-02-15 | Disposition: A | Discharge status: Home / Self Care | Payer: Medicare Other | Source: Ambulatory Visit | Attending: Cardiovascular Disease | Admitting: Cardiovascular Disease

## 2014-02-15 DIAGNOSIS — I2510 Atherosclerotic heart disease of native coronary artery without angina pectoris: Secondary | ICD-10-CM | POA: Diagnosis not present

## 2014-02-15 DIAGNOSIS — Z951 Presence of aortocoronary bypass graft: Secondary | ICD-10-CM | POA: Diagnosis not present

## 2014-02-17 ENCOUNTER — Encounter (HOSPITAL_COMMUNITY)
Admission: RE | Admit: 2014-02-17 | Discharge: 2014-02-17 | Disposition: A | Discharge status: Home / Self Care | Payer: Medicare Other | Source: Ambulatory Visit | Attending: Cardiovascular Disease | Admitting: Cardiovascular Disease

## 2014-02-17 DIAGNOSIS — Z951 Presence of aortocoronary bypass graft: Secondary | ICD-10-CM | POA: Diagnosis not present

## 2014-02-17 DIAGNOSIS — I2510 Atherosclerotic heart disease of native coronary artery without angina pectoris: Secondary | ICD-10-CM | POA: Diagnosis not present

## 2014-02-20 ENCOUNTER — Encounter (HOSPITAL_COMMUNITY)
Admission: RE | Admit: 2014-02-20 | Discharge: 2014-02-20 | Disposition: A | Discharge status: Home / Self Care | Payer: Medicare Other | Source: Ambulatory Visit | Attending: Cardiovascular Disease | Admitting: Cardiovascular Disease

## 2014-02-20 DIAGNOSIS — Z951 Presence of aortocoronary bypass graft: Secondary | ICD-10-CM | POA: Diagnosis not present

## 2014-02-20 DIAGNOSIS — I2510 Atherosclerotic heart disease of native coronary artery without angina pectoris: Secondary | ICD-10-CM | POA: Diagnosis not present

## 2014-02-22 ENCOUNTER — Encounter (HOSPITAL_COMMUNITY)
Admission: RE | Admit: 2014-02-22 | Discharge: 2014-02-22 | Disposition: A | Discharge status: Home / Self Care | Payer: Medicare Other | Source: Ambulatory Visit | Attending: Cardiovascular Disease | Admitting: Cardiovascular Disease

## 2014-02-22 DIAGNOSIS — I2510 Atherosclerotic heart disease of native coronary artery without angina pectoris: Secondary | ICD-10-CM | POA: Diagnosis not present

## 2014-02-22 DIAGNOSIS — Z951 Presence of aortocoronary bypass graft: Secondary | ICD-10-CM | POA: Diagnosis not present

## 2014-02-24 ENCOUNTER — Encounter (HOSPITAL_COMMUNITY): Admission: RE | Admit: 2014-02-24 | Payer: Medicare Other | Source: Ambulatory Visit

## 2014-02-27 ENCOUNTER — Encounter (HOSPITAL_COMMUNITY): Payer: Medicare Other

## 2014-03-01 ENCOUNTER — Encounter (HOSPITAL_COMMUNITY)
Admission: RE | Admit: 2014-03-01 | Discharge: 2014-03-01 | Disposition: A | Discharge status: Home / Self Care | Payer: Medicare Other | Source: Ambulatory Visit | Attending: Cardiovascular Disease | Admitting: Cardiovascular Disease

## 2014-03-01 DIAGNOSIS — Z951 Presence of aortocoronary bypass graft: Secondary | ICD-10-CM | POA: Diagnosis not present

## 2014-03-01 DIAGNOSIS — I2510 Atherosclerotic heart disease of native coronary artery without angina pectoris: Secondary | ICD-10-CM | POA: Diagnosis not present

## 2014-03-03 ENCOUNTER — Encounter (HOSPITAL_COMMUNITY)
Admission: RE | Admit: 2014-03-03 | Discharge: 2014-03-03 | Disposition: A | Discharge status: Home / Self Care | Payer: Medicare Other | Source: Ambulatory Visit | Attending: Cardiovascular Disease | Admitting: Cardiovascular Disease

## 2014-03-03 DIAGNOSIS — I2510 Atherosclerotic heart disease of native coronary artery without angina pectoris: Secondary | ICD-10-CM | POA: Diagnosis not present

## 2014-03-03 DIAGNOSIS — Z951 Presence of aortocoronary bypass graft: Secondary | ICD-10-CM | POA: Diagnosis not present

## 2014-03-06 ENCOUNTER — Encounter (HOSPITAL_COMMUNITY)
Admission: RE | Admit: 2014-03-06 | Discharge: 2014-03-06 | Disposition: A | Discharge status: Home / Self Care | Payer: Medicare Other | Source: Ambulatory Visit | Attending: Cardiovascular Disease | Admitting: Cardiovascular Disease

## 2014-03-06 DIAGNOSIS — I2510 Atherosclerotic heart disease of native coronary artery without angina pectoris: Secondary | ICD-10-CM | POA: Insufficient documentation

## 2014-03-06 DIAGNOSIS — Z951 Presence of aortocoronary bypass graft: Secondary | ICD-10-CM | POA: Insufficient documentation

## 2014-03-06 NOTE — Progress Notes (Signed)
Cardiac Rehabilitation Program Outcomes Report   Orientation:  01/05/14 Graduate Date:  tbd Discharge Date:  tbd # of sessions completed: 18  Cardiologist: Hamilton Capri Family MD:  Dr. Olena Heckle Class Time:  0930  A.  Exercise Program:  Tolerates exercise @ 3.65 METS for 30 minutes  B.  Mental Health:  Good mental attitude  C.  Education/Instruction/Skills  Accurately checks own pulse.  Rest:  67  Exercise:  92, Knows THR for exercise and Uses Perceived Exertion Scale and/or Dyspnea Scale    D.  Nutrition/Weight Control/Body Composition:  Adherence to prescribed nutrition program: good    E.  Blood Lipids   No results found for: CHOL, HDL, LDLCALC, LDLDIRECT, TRIG, CHOLHDL  F.  Lifestyle Changes:  Making positive lifestyle changes  G.  Symptoms noted with exercise:  Asymptomatic  Report Completed By:  Felicity Coyer, RN   Comments:  Halfway note.

## 2014-03-08 ENCOUNTER — Encounter (HOSPITAL_COMMUNITY)
Admission: RE | Admit: 2014-03-08 | Discharge: 2014-03-08 | Disposition: A | Discharge status: Home / Self Care | Payer: Medicare Other | Source: Ambulatory Visit | Attending: Cardiovascular Disease | Admitting: Cardiovascular Disease

## 2014-03-08 DIAGNOSIS — I2510 Atherosclerotic heart disease of native coronary artery without angina pectoris: Secondary | ICD-10-CM | POA: Diagnosis not present

## 2014-03-08 DIAGNOSIS — Z951 Presence of aortocoronary bypass graft: Secondary | ICD-10-CM | POA: Diagnosis not present

## 2014-03-10 ENCOUNTER — Encounter (HOSPITAL_COMMUNITY)
Admission: RE | Admit: 2014-03-10 | Discharge: 2014-03-10 | Disposition: A | Discharge status: Home / Self Care | Payer: Medicare Other | Source: Ambulatory Visit | Attending: Cardiovascular Disease | Admitting: Cardiovascular Disease

## 2014-03-10 DIAGNOSIS — Z951 Presence of aortocoronary bypass graft: Secondary | ICD-10-CM | POA: Diagnosis not present

## 2014-03-10 DIAGNOSIS — I2510 Atherosclerotic heart disease of native coronary artery without angina pectoris: Secondary | ICD-10-CM | POA: Diagnosis not present

## 2014-03-13 ENCOUNTER — Encounter (HOSPITAL_COMMUNITY)
Admission: RE | Admit: 2014-03-13 | Discharge: 2014-03-13 | Disposition: A | Discharge status: Home / Self Care | Payer: Medicare Other | Source: Ambulatory Visit | Attending: Cardiovascular Disease | Admitting: Cardiovascular Disease

## 2014-03-13 DIAGNOSIS — I2510 Atherosclerotic heart disease of native coronary artery without angina pectoris: Secondary | ICD-10-CM | POA: Diagnosis not present

## 2014-03-13 DIAGNOSIS — Z951 Presence of aortocoronary bypass graft: Secondary | ICD-10-CM | POA: Diagnosis not present

## 2014-03-15 ENCOUNTER — Encounter (HOSPITAL_COMMUNITY)
Admission: RE | Admit: 2014-03-15 | Discharge: 2014-03-15 | Disposition: A | Discharge status: Home / Self Care | Payer: Medicare Other | Source: Ambulatory Visit | Attending: Cardiovascular Disease | Admitting: Cardiovascular Disease

## 2014-03-15 DIAGNOSIS — Z951 Presence of aortocoronary bypass graft: Secondary | ICD-10-CM | POA: Diagnosis not present

## 2014-03-15 DIAGNOSIS — I2510 Atherosclerotic heart disease of native coronary artery without angina pectoris: Secondary | ICD-10-CM | POA: Diagnosis not present

## 2014-03-17 ENCOUNTER — Encounter (HOSPITAL_COMMUNITY)
Admission: RE | Admit: 2014-03-17 | Discharge: 2014-03-17 | Disposition: A | Discharge status: Home / Self Care | Payer: Medicare Other | Source: Ambulatory Visit | Attending: Cardiovascular Disease | Admitting: Cardiovascular Disease

## 2014-03-17 DIAGNOSIS — Z951 Presence of aortocoronary bypass graft: Secondary | ICD-10-CM | POA: Diagnosis not present

## 2014-03-17 DIAGNOSIS — I2510 Atherosclerotic heart disease of native coronary artery without angina pectoris: Secondary | ICD-10-CM | POA: Diagnosis not present

## 2014-03-20 ENCOUNTER — Encounter (HOSPITAL_COMMUNITY): Payer: Medicare Other

## 2014-03-22 ENCOUNTER — Encounter (HOSPITAL_COMMUNITY)
Admission: RE | Admit: 2014-03-22 | Discharge: 2014-03-22 | Disposition: A | Discharge status: Home / Self Care | Payer: Medicare Other | Source: Ambulatory Visit | Attending: Cardiovascular Disease | Admitting: Cardiovascular Disease

## 2014-03-22 DIAGNOSIS — Z951 Presence of aortocoronary bypass graft: Secondary | ICD-10-CM | POA: Diagnosis not present

## 2014-03-22 DIAGNOSIS — I2510 Atherosclerotic heart disease of native coronary artery without angina pectoris: Secondary | ICD-10-CM | POA: Diagnosis not present

## 2014-03-24 ENCOUNTER — Encounter (HOSPITAL_COMMUNITY)
Admission: RE | Admit: 2014-03-24 | Discharge: 2014-03-24 | Disposition: A | Discharge status: Home / Self Care | Payer: Medicare Other | Source: Ambulatory Visit | Attending: Cardiovascular Disease | Admitting: Cardiovascular Disease

## 2014-03-24 DIAGNOSIS — I2510 Atherosclerotic heart disease of native coronary artery without angina pectoris: Secondary | ICD-10-CM | POA: Diagnosis not present

## 2014-03-24 DIAGNOSIS — Z951 Presence of aortocoronary bypass graft: Secondary | ICD-10-CM | POA: Diagnosis not present

## 2014-03-27 ENCOUNTER — Encounter (HOSPITAL_COMMUNITY)
Admission: RE | Admit: 2014-03-27 | Discharge: 2014-03-27 | Disposition: A | Discharge status: Home / Self Care | Payer: Medicare Other | Source: Ambulatory Visit | Attending: Cardiovascular Disease | Admitting: Cardiovascular Disease

## 2014-03-27 DIAGNOSIS — I2510 Atherosclerotic heart disease of native coronary artery without angina pectoris: Secondary | ICD-10-CM | POA: Diagnosis not present

## 2014-03-27 DIAGNOSIS — Z951 Presence of aortocoronary bypass graft: Secondary | ICD-10-CM | POA: Diagnosis not present

## 2014-03-29 ENCOUNTER — Encounter (HOSPITAL_COMMUNITY)
Admission: RE | Admit: 2014-03-29 | Discharge: 2014-03-29 | Disposition: A | Discharge status: Home / Self Care | Payer: Medicare Other | Source: Ambulatory Visit | Attending: Cardiovascular Disease | Admitting: Cardiovascular Disease

## 2014-03-29 DIAGNOSIS — Z951 Presence of aortocoronary bypass graft: Secondary | ICD-10-CM | POA: Diagnosis not present

## 2014-03-29 DIAGNOSIS — I2510 Atherosclerotic heart disease of native coronary artery without angina pectoris: Secondary | ICD-10-CM | POA: Diagnosis not present

## 2014-03-31 ENCOUNTER — Encounter (HOSPITAL_COMMUNITY)
Admission: RE | Admit: 2014-03-31 | Discharge: 2014-03-31 | Disposition: A | Discharge status: Home / Self Care | Payer: Medicare Other | Source: Ambulatory Visit | Attending: Cardiovascular Disease | Admitting: Cardiovascular Disease

## 2014-03-31 DIAGNOSIS — Z951 Presence of aortocoronary bypass graft: Secondary | ICD-10-CM | POA: Diagnosis not present

## 2014-03-31 DIAGNOSIS — I2510 Atherosclerotic heart disease of native coronary artery without angina pectoris: Secondary | ICD-10-CM | POA: Diagnosis not present

## 2014-04-03 ENCOUNTER — Encounter (HOSPITAL_COMMUNITY)
Admission: RE | Admit: 2014-04-03 | Discharge: 2014-04-03 | Disposition: A | Discharge status: Home / Self Care | Payer: Medicare Other | Source: Ambulatory Visit | Attending: Cardiovascular Disease | Admitting: Cardiovascular Disease

## 2014-04-03 DIAGNOSIS — I2510 Atherosclerotic heart disease of native coronary artery without angina pectoris: Secondary | ICD-10-CM | POA: Diagnosis not present

## 2014-04-03 DIAGNOSIS — Z951 Presence of aortocoronary bypass graft: Secondary | ICD-10-CM | POA: Diagnosis not present

## 2014-04-05 ENCOUNTER — Encounter (HOSPITAL_COMMUNITY)
Admission: RE | Admit: 2014-04-05 | Discharge: 2014-04-05 | Disposition: A | Discharge status: Home / Self Care | Payer: Medicare Other | Source: Ambulatory Visit | Attending: Cardiovascular Disease | Admitting: Cardiovascular Disease

## 2014-04-05 DIAGNOSIS — I2510 Atherosclerotic heart disease of native coronary artery without angina pectoris: Secondary | ICD-10-CM | POA: Diagnosis not present

## 2014-04-05 DIAGNOSIS — Z951 Presence of aortocoronary bypass graft: Secondary | ICD-10-CM | POA: Diagnosis not present

## 2014-04-07 ENCOUNTER — Encounter (HOSPITAL_COMMUNITY): Payer: Medicare Other

## 2014-04-10 ENCOUNTER — Encounter (HOSPITAL_COMMUNITY)
Admission: RE | Admit: 2014-04-10 | Discharge: 2014-04-10 | Disposition: A | Discharge status: Home / Self Care | Payer: Medicare Other | Source: Ambulatory Visit | Attending: Cardiovascular Disease | Admitting: Cardiovascular Disease

## 2014-04-10 DIAGNOSIS — Z951 Presence of aortocoronary bypass graft: Secondary | ICD-10-CM | POA: Diagnosis not present

## 2014-04-10 DIAGNOSIS — I2510 Atherosclerotic heart disease of native coronary artery without angina pectoris: Secondary | ICD-10-CM | POA: Diagnosis not present

## 2014-04-12 ENCOUNTER — Encounter (HOSPITAL_COMMUNITY)
Admission: RE | Admit: 2014-04-12 | Discharge: 2014-04-12 | Disposition: A | Discharge status: Home / Self Care | Payer: Medicare Other | Source: Ambulatory Visit | Attending: Cardiovascular Disease | Admitting: Cardiovascular Disease

## 2014-04-12 DIAGNOSIS — I2510 Atherosclerotic heart disease of native coronary artery without angina pectoris: Secondary | ICD-10-CM | POA: Diagnosis not present

## 2014-04-12 DIAGNOSIS — Z951 Presence of aortocoronary bypass graft: Secondary | ICD-10-CM | POA: Diagnosis not present

## 2014-04-14 ENCOUNTER — Encounter (HOSPITAL_COMMUNITY)
Admission: RE | Admit: 2014-04-14 | Discharge: 2014-04-14 | Disposition: A | Discharge status: Home / Self Care | Payer: Medicare Other | Source: Ambulatory Visit | Attending: Cardiovascular Disease | Admitting: Cardiovascular Disease

## 2014-04-14 DIAGNOSIS — Z951 Presence of aortocoronary bypass graft: Secondary | ICD-10-CM | POA: Diagnosis not present

## 2014-04-14 DIAGNOSIS — I2510 Atherosclerotic heart disease of native coronary artery without angina pectoris: Secondary | ICD-10-CM | POA: Diagnosis not present

## 2014-04-17 ENCOUNTER — Encounter (HOSPITAL_COMMUNITY)
Admission: RE | Admit: 2014-04-17 | Discharge: 2014-04-17 | Disposition: A | Discharge status: Home / Self Care | Payer: Medicare Other | Source: Ambulatory Visit | Attending: Cardiovascular Disease | Admitting: Cardiovascular Disease

## 2014-04-17 DIAGNOSIS — L570 Actinic keratosis: Secondary | ICD-10-CM | POA: Diagnosis not present

## 2014-04-17 DIAGNOSIS — I2510 Atherosclerotic heart disease of native coronary artery without angina pectoris: Secondary | ICD-10-CM | POA: Diagnosis not present

## 2014-04-17 DIAGNOSIS — L821 Other seborrheic keratosis: Secondary | ICD-10-CM | POA: Diagnosis not present

## 2014-04-17 DIAGNOSIS — Z951 Presence of aortocoronary bypass graft: Secondary | ICD-10-CM | POA: Diagnosis not present

## 2014-04-19 ENCOUNTER — Encounter (HOSPITAL_COMMUNITY)
Admission: RE | Admit: 2014-04-19 | Discharge: 2014-04-19 | Disposition: A | Discharge status: Home / Self Care | Payer: Medicare Other | Source: Ambulatory Visit | Attending: Cardiovascular Disease | Admitting: Cardiovascular Disease

## 2014-04-19 DIAGNOSIS — I2510 Atherosclerotic heart disease of native coronary artery without angina pectoris: Secondary | ICD-10-CM | POA: Diagnosis not present

## 2014-04-19 DIAGNOSIS — Z951 Presence of aortocoronary bypass graft: Secondary | ICD-10-CM | POA: Diagnosis not present

## 2014-04-21 ENCOUNTER — Encounter (HOSPITAL_COMMUNITY)
Admission: RE | Admit: 2014-04-21 | Discharge: 2014-04-21 | Disposition: A | Discharge status: Home / Self Care | Payer: Medicare Other | Source: Ambulatory Visit | Attending: Cardiovascular Disease | Admitting: Cardiovascular Disease

## 2014-04-21 DIAGNOSIS — I2510 Atherosclerotic heart disease of native coronary artery without angina pectoris: Secondary | ICD-10-CM | POA: Diagnosis not present

## 2014-04-21 DIAGNOSIS — Z951 Presence of aortocoronary bypass graft: Secondary | ICD-10-CM | POA: Diagnosis not present

## 2014-04-24 ENCOUNTER — Encounter (HOSPITAL_COMMUNITY): Payer: Medicare Other

## 2014-04-24 NOTE — Progress Notes (Signed)
Cardiac Rehabilitation Program Outcomes Report   Orientation:  01/05/14 Graduate Date:  04/21/14 Discharge Date:  04/21/14 # of sessions completed: 36  Cardiologist: Dr. Hamilton Capri Family MD:  Dr. Prudy Feeler Time:  0930  A.  Exercise Program:  Tolerates exercise @ 3.67 METS for 30 minutes, Walk Test Results:  Pre: 1133ft, No Change functional capacity  0 %, Decreased  muscular strength  -1.5 %, Improved  flexibility 0.25 %, Discharged to home exercise program.  Anticipated compliance:  good and Discharged  B.  Mental Health:  Good mental attitude  C.  Education/Instruction/Skills  Accurately checks own pulse.  Rest:  57  Exercise:  73, Knows THR for exercise, Uses Perceived Exertion Scale and/or Dyspnea Scale and Attended all education classes  D.  Nutrition/Weight Control/Body Composition:  Adherence to prescribed nutrition program: good    E.  Blood Lipids   No results found for: CHOL, HDL, LDLCALC, LDLDIRECT, TRIG, CHOLHDL  F.  Lifestyle Changes:  Making positive lifestyle changes  G.  Symptoms noted with exercise:  Asymptomatic  Report Completed By:  Felicity Coyer, RN    Comments:  Graduation note.  Pt graduated with 36 sessions.  Plans to exercise at home. Encouraged maintenance program.

## 2014-04-24 NOTE — Progress Notes (Signed)
Patient is discharged from [facility] program April 21, 2014 with 36 sessions.  He achieved LTG of 30 minutes of aerobic exercise at max met level of 3.67.  All patient vitals are WNL.  Discharge instructions have been reviewed in detail and patient expressed an understanding of material given.  Patient plans to exercise at home. Cardiac Rehab will make 1 month, 6 month and 1 year call backs.  Patient had no complaints of any abnormal S/S or pain on their exit visit.

## 2014-06-22 DIAGNOSIS — I10 Essential (primary) hypertension: Secondary | ICD-10-CM | POA: Diagnosis not present

## 2014-06-22 DIAGNOSIS — K210 Gastro-esophageal reflux disease with esophagitis: Secondary | ICD-10-CM | POA: Diagnosis not present

## 2014-06-22 DIAGNOSIS — E782 Mixed hyperlipidemia: Secondary | ICD-10-CM | POA: Diagnosis not present

## 2014-06-22 DIAGNOSIS — E1122 Type 2 diabetes mellitus with diabetic chronic kidney disease: Secondary | ICD-10-CM | POA: Diagnosis not present

## 2014-06-22 DIAGNOSIS — I2510 Atherosclerotic heart disease of native coronary artery without angina pectoris: Secondary | ICD-10-CM | POA: Diagnosis not present

## 2014-06-28 DIAGNOSIS — I10 Essential (primary) hypertension: Secondary | ICD-10-CM | POA: Diagnosis not present

## 2014-06-28 DIAGNOSIS — J301 Allergic rhinitis due to pollen: Secondary | ICD-10-CM | POA: Diagnosis not present

## 2014-06-28 DIAGNOSIS — E782 Mixed hyperlipidemia: Secondary | ICD-10-CM | POA: Diagnosis not present

## 2014-06-28 DIAGNOSIS — Z1389 Encounter for screening for other disorder: Secondary | ICD-10-CM | POA: Diagnosis not present

## 2014-06-28 DIAGNOSIS — I2510 Atherosclerotic heart disease of native coronary artery without angina pectoris: Secondary | ICD-10-CM | POA: Diagnosis not present

## 2014-06-28 DIAGNOSIS — F331 Major depressive disorder, recurrent, moderate: Secondary | ICD-10-CM | POA: Diagnosis not present

## 2014-06-28 DIAGNOSIS — N183 Chronic kidney disease, stage 3 (moderate): Secondary | ICD-10-CM | POA: Diagnosis not present

## 2014-06-28 DIAGNOSIS — K219 Gastro-esophageal reflux disease without esophagitis: Secondary | ICD-10-CM | POA: Diagnosis not present

## 2014-06-28 DIAGNOSIS — E6609 Other obesity due to excess calories: Secondary | ICD-10-CM | POA: Diagnosis not present

## 2014-06-28 DIAGNOSIS — G300 Alzheimer's disease with early onset: Secondary | ICD-10-CM | POA: Diagnosis not present

## 2014-06-28 DIAGNOSIS — E1122 Type 2 diabetes mellitus with diabetic chronic kidney disease: Secondary | ICD-10-CM | POA: Diagnosis not present

## 2014-06-28 DIAGNOSIS — M1000 Idiopathic gout, unspecified site: Secondary | ICD-10-CM | POA: Diagnosis not present

## 2014-07-04 ENCOUNTER — Encounter: Payer: Self-pay | Admitting: *Deleted

## 2014-07-04 ENCOUNTER — Other Ambulatory Visit: Payer: Self-pay | Admitting: *Deleted

## 2014-07-04 NOTE — Patient Outreach (Signed)
07/04/14- Referral received from MD office related to data analysis, telephone call to patient, HIPAA verified, RN CM explained [facility] program.  Pt has diabetes and states " I don't check every day"  When questioned about blood sugar readings pt states " I really don't know"  Pt would not offer any further information related to diabetes.  Pt reports blood pressure under good control,  Pt states he has all medications and able to afford and taking as prescribed.  Pt denies any falls in the past few months.  Pt states more than once that he is not interested in [facility] program and has no needs and seemed in a hurry to end the conversation, pt agreeable to have [facility] brochure and contact number mailed to his home, letter with information to be mailed.  RN CM faxed letter to Dr. Quillian Quince informing pt refused [facility] program.  Jacqlyn Larsen [facility], [place] Coordinator [phone]

## 2014-07-04 NOTE — Patient Outreach (Signed)
[facility]) Care Management  07/04/2014  Luis Wolfe [birth year] [id number]   Referral with MD notes received from MD office, assigned Jacqlyn Larsen, RN for telephone outreach.  [name]. [place], [place] Management [place] Assistant Phone: [phone] Fax: [phone]

## 2014-07-10 NOTE — Patient Outreach (Signed)
[facility]) Care Management  07/10/2014  Luis Wolfe 09/20/1940 [id number]   Notification from Luis Larsen, RN to close case due to patient refused [place] [facility].  Luis Wolfe. [place], [place] Management [place] Assistant Phone: [phone] Fax: [phone]

## 2014-07-27 DIAGNOSIS — Z951 Presence of aortocoronary bypass graft: Secondary | ICD-10-CM | POA: Diagnosis not present

## 2014-07-28 DIAGNOSIS — H40021 Open angle with borderline findings, high risk, right eye: Secondary | ICD-10-CM | POA: Diagnosis not present

## 2014-07-28 DIAGNOSIS — H4060X0 Glaucoma secondary to drugs, unspecified eye, stage unspecified: Secondary | ICD-10-CM | POA: Diagnosis not present

## 2014-07-28 DIAGNOSIS — H35033 Hypertensive retinopathy, bilateral: Secondary | ICD-10-CM | POA: Diagnosis not present

## 2014-07-28 DIAGNOSIS — H26492 Other secondary cataract, left eye: Secondary | ICD-10-CM | POA: Diagnosis not present

## 2014-07-28 DIAGNOSIS — H524 Presbyopia: Secondary | ICD-10-CM | POA: Diagnosis not present

## 2014-07-28 DIAGNOSIS — H01003 Unspecified blepharitis right eye, unspecified eyelid: Secondary | ICD-10-CM | POA: Diagnosis not present

## 2014-08-31 ENCOUNTER — Encounter: Payer: Self-pay | Admitting: Gastroenterology

## 2014-10-18 DIAGNOSIS — L570 Actinic keratosis: Secondary | ICD-10-CM | POA: Diagnosis not present

## 2014-10-18 DIAGNOSIS — L821 Other seborrheic keratosis: Secondary | ICD-10-CM | POA: Diagnosis not present

## 2014-11-08 DIAGNOSIS — H26492 Other secondary cataract, left eye: Secondary | ICD-10-CM | POA: Diagnosis not present

## 2014-11-08 DIAGNOSIS — H40021 Open angle with borderline findings, high risk, right eye: Secondary | ICD-10-CM | POA: Diagnosis not present

## 2014-11-08 DIAGNOSIS — H01003 Unspecified blepharitis right eye, unspecified eyelid: Secondary | ICD-10-CM | POA: Diagnosis not present

## 2014-11-08 DIAGNOSIS — H4060X0 Glaucoma secondary to drugs, unspecified eye, stage unspecified: Secondary | ICD-10-CM | POA: Diagnosis not present

## 2014-11-08 DIAGNOSIS — H43811 Vitreous degeneration, right eye: Secondary | ICD-10-CM | POA: Diagnosis not present

## 2015-03-05 DIAGNOSIS — E782 Mixed hyperlipidemia: Secondary | ICD-10-CM | POA: Diagnosis not present

## 2015-03-05 DIAGNOSIS — E1122 Type 2 diabetes mellitus with diabetic chronic kidney disease: Secondary | ICD-10-CM | POA: Diagnosis not present

## 2015-03-05 DIAGNOSIS — J301 Allergic rhinitis due to pollen: Secondary | ICD-10-CM | POA: Diagnosis not present

## 2015-03-05 DIAGNOSIS — N183 Chronic kidney disease, stage 3 (moderate): Secondary | ICD-10-CM | POA: Diagnosis not present

## 2015-03-05 DIAGNOSIS — F331 Major depressive disorder, recurrent, moderate: Secondary | ICD-10-CM | POA: Diagnosis not present

## 2015-03-05 DIAGNOSIS — K210 Gastro-esophageal reflux disease with esophagitis: Secondary | ICD-10-CM | POA: Diagnosis not present

## 2015-03-05 DIAGNOSIS — M1000 Idiopathic gout, unspecified site: Secondary | ICD-10-CM | POA: Diagnosis not present

## 2015-03-05 DIAGNOSIS — G300 Alzheimer's disease with early onset: Secondary | ICD-10-CM | POA: Diagnosis not present

## 2015-03-05 DIAGNOSIS — E6609 Other obesity due to excess calories: Secondary | ICD-10-CM | POA: Diagnosis not present

## 2015-03-05 DIAGNOSIS — I2510 Atherosclerotic heart disease of native coronary artery without angina pectoris: Secondary | ICD-10-CM | POA: Diagnosis not present

## 2015-03-05 DIAGNOSIS — Z23 Encounter for immunization: Secondary | ICD-10-CM | POA: Diagnosis not present

## 2015-03-05 DIAGNOSIS — K219 Gastro-esophageal reflux disease without esophagitis: Secondary | ICD-10-CM | POA: Diagnosis not present

## 2015-03-05 DIAGNOSIS — I10 Essential (primary) hypertension: Secondary | ICD-10-CM | POA: Diagnosis not present

## 2015-03-14 DIAGNOSIS — Z23 Encounter for immunization: Secondary | ICD-10-CM | POA: Diagnosis not present

## 2015-05-01 DIAGNOSIS — L97519 Non-pressure chronic ulcer of other part of right foot with unspecified severity: Secondary | ICD-10-CM | POA: Diagnosis not present

## 2015-05-01 DIAGNOSIS — L02611 Cutaneous abscess of right foot: Secondary | ICD-10-CM | POA: Diagnosis not present

## 2015-06-28 ENCOUNTER — Encounter: Payer: Self-pay | Admitting: Gastroenterology

## 2015-08-16 ENCOUNTER — Ambulatory Visit (AMBULATORY_SURGERY_CENTER): Payer: Medicare Other | Admitting: *Deleted

## 2015-08-16 VITALS — Ht 67.0 in | Wt 213.4 lb

## 2015-08-16 DIAGNOSIS — Z86010 Personal history of colonic polyps: Secondary | ICD-10-CM

## 2015-08-16 MED ORDER — SUPREP BOWEL PREP KIT 17.5-3.13-1.6 GM/177ML PO SOLN: 1.0000 | Freq: Once | ORAL | Status: DC

## 2015-08-16 NOTE — Progress Notes (Signed)
Patient denies any allergies to egg or soy products. Patient denies complications with anesthesia/sedation.  Patient denies oxygen use at home and denies diet medications. Emmi instructions for colonoscopy explained but patient denied.  Pamphlet given.  

## 2015-08-30 ENCOUNTER — Encounter: Payer: Self-pay | Admitting: Gastroenterology

## 2015-08-30 ENCOUNTER — Ambulatory Visit (AMBULATORY_SURGERY_CENTER): Payer: Medicare Other | Admitting: Gastroenterology

## 2015-08-30 VITALS — BP 119/68 | HR 55 | Temp 96.8°F | Resp 16 | Ht 67.0 in | Wt 213.0 lb

## 2015-08-30 DIAGNOSIS — Z86010 Personal history of colonic polyps: Secondary | ICD-10-CM | POA: Diagnosis not present

## 2015-08-30 DIAGNOSIS — D120 Benign neoplasm of cecum: Secondary | ICD-10-CM

## 2015-08-30 DIAGNOSIS — Z1211 Encounter for screening for malignant neoplasm of colon: Secondary | ICD-10-CM | POA: Diagnosis not present

## 2015-08-30 NOTE — Patient Instructions (Signed)
YOU HAD AN ENDOSCOPIC PROCEDURE TODAY AT THE [facility]:   Refer to the procedure report that was given to you for any specific questions about what was found during the examination.  If the procedure report does not answer your questions, please call your gastroenterologist to clarify.  If you requested that your care partner not be given the details of your procedure findings, then the procedure report has been included in a sealed envelope for you to review at your convenience later.  YOU SHOULD EXPECT: Some feelings of bloating in the abdomen. Passage of more gas than usual.  Walking can help get rid of the air that was put into your GI tract during the procedure and reduce the bloating. If you had a lower endoscopy (such as a colonoscopy or flexible sigmoidoscopy) you may notice spotting of blood in your stool or on the toilet paper. If you underwent a bowel prep for your procedure, you may not have a normal bowel movement for a few days.  Please Note:  You might notice some irritation and congestion in your nose or some drainage.  This is from the oxygen used during your procedure.  There is no need for concern and it should clear up in a day or so.  SYMPTOMS TO REPORT IMMEDIATELY:   Following lower endoscopy (colonoscopy or flexible sigmoidoscopy):  Excessive amounts of blood in the stool  Significant tenderness or worsening of abdominal pains  Swelling of the abdomen that is new, acute  Fever of 100F or higher   For urgent or emergent issues, a gastroenterologist can be reached at any hour by calling [phone].   DIET: Your first meal following the procedure should be a small meal and then it is ok to progress to your normal diet. Heavy or fried foods are harder to digest and may make you feel nauseous or bloated.  Likewise, meals heavy in dairy and vegetables can increase bloating.  Drink plenty of fluids but you should avoid alcoholic beverages for 24  hours.  ACTIVITY:  You should plan to take it easy for the rest of today and you should NOT DRIVE or use heavy machinery until tomorrow (because of the sedation medicines used during the test).    FOLLOW UP: Our staff will call the number listed on your records the next business day following your procedure to check on you and address any questions or concerns that you may have regarding the information given to you following your procedure. If we do not reach you, we will leave a message.  However, if you are feeling well and you are not experiencing any problems, there is no need to return our call.  We will assume that you have returned to your regular daily activities without incident.  If any biopsies were taken you will be contacted by phone or by letter within the next 1-3 weeks.  Please call us at [phone] if you have not heard about the biopsies in 3 weeks.    SIGNATURES/CONFIDENTIALITY: You and/or your care partner have signed paperwork which will be entered into your electronic medical record.  These signatures attest to the fact that that the information above on your After Visit Summary has been reviewed and is understood.  Full responsibility of the confidentiality of this discharge information lies with you and/or your care-partner. 

## 2015-08-30 NOTE — Progress Notes (Signed)
A and O x3. Report to RN. Tolerated MAC anesthesia well. 

## 2015-08-30 NOTE — Progress Notes (Signed)
Called to room to assist during endoscopic procedure.  Patient ID and intended procedure confirmed with present staff. Received instructions for my participation in the procedure from the performing physician.  

## 2015-08-30 NOTE — Progress Notes (Signed)
Pt has some memory issues.  He asked I ask his wife his could help him with his questions.  Wife brought back and she answered all questions for the pt. maw

## 2015-08-30 NOTE — Op Note (Signed)
[facility] Patient Name: Luis Wolfe Procedure Date: 08/30/2015 7:27 AM MRN: [id number] Endoscopist: Mauri Pole , MD Age: 75 Referring MD:  Date of Birth: 11-02-1940 Gender: Male Account #: [id number] Procedure:                Colonoscopy Indications:              High risk colon cancer surveillance: Personal                            history of adenoma (10 mm or greater in size) Medicines:                Monitored Anesthesia Care Procedure:                Pre-Anesthesia Assessment:                           - Prior to the procedure, a History and Physical                            was performed, and patient medications and                            allergies were reviewed. The patient's tolerance of                            previous anesthesia was also reviewed. The risks                            and benefits of the procedure and the sedation                            options and risks were discussed with the patient.                            All questions were answered, and informed consent                            was obtained. Prior Anticoagulants: The patient                            last took aspirin on the day of the procedure. ASA                            Grade Assessment: II - A patient with mild systemic                            disease. After reviewing the risks and benefits,                            the patient was deemed in satisfactory condition to                            undergo the procedure.  After obtaining informed consent, the colonoscope                            was passed under direct vision. Throughout the                            procedure, the patient's blood pressure, pulse, and                            oxygen saturations were monitored continuously. The                            Model CF-HQ190L [id number]) scope was introduced                            through the anus and advanced  to the the cecum,                            identified by appendiceal orifice and ileocecal                            valve. The colonoscopy was performed without                            difficulty. The patient tolerated the procedure                            well. The quality of the bowel preparation was                            good. The ileocecal valve, appendiceal orifice, and                            rectum were photographed. Scope In: 8:10:25 AM Scope Out: 8:26:51 AM Scope Withdrawal Time: 0 hours 9 minutes 45 seconds  Total Procedure Duration: 0 hours 16 minutes 26 seconds  Findings:                 The perianal and digital rectal examinations were                            normal.                           A 5 mm polyp was found in the cecum. The polyp was                            sessile. The polyp was removed with a cold snare.                            Resection and retrieval were complete.                           Multiple small and large-mouthed diverticula were  found in the entire colon. Complications:            No immediate complications. Estimated Blood Loss:     Estimated blood loss was minimal. Impression:               - One 5 mm polyp in the cecum, removed with a cold                            snare. Resected and retrieved.                           - Diverticulosis in the entire examined colon. Recommendation:           - Patient has a contact number available for                            emergencies. The signs and symptoms of potential                            delayed complications were discussed with the                            patient. Return to normal activities tomorrow.                            Written discharge instructions were provided to the                            patient.                           - Resume previous diet.                           - Continue present medications.                            - Await pathology results.                           - Repeat colonoscopy in 5 years for surveillance.                           - Return to GI clinic PRN. Mauri Pole, MD 08/30/2015 8:35:09 AM This report has been signed electronically.

## 2015-08-31 ENCOUNTER — Telehealth: Payer: Self-pay | Admitting: *Deleted

## 2015-08-31 NOTE — Telephone Encounter (Signed)
  Follow up Call-  Call back number 08/30/2015  Post procedure Call Back phone  # [phone] cell  Permission to leave phone message Yes  Some recent data might be hidden    Spoke with wife Patient questions:  Do you have a fever, pain , or abdominal swelling? No. Pain Score  0 *  Have you tolerated food without any problems? Yes.    Have you been able to return to your normal activities? Yes.    Do you have any questions about your discharge instructions: Diet   No. Medications  No. Follow up visit  No.  Do you have questions or concerns about your Care? No.  Actions: * If pain score is 4 or above: No action needed, pain <4.

## 2015-09-05 ENCOUNTER — Encounter: Payer: Self-pay | Admitting: Gastroenterology

## 2015-10-09 ENCOUNTER — Other Ambulatory Visit: Payer: Self-pay

## 2015-10-25 DIAGNOSIS — I10 Essential (primary) hypertension: Secondary | ICD-10-CM | POA: Diagnosis not present

## 2015-10-25 DIAGNOSIS — K219 Gastro-esophageal reflux disease without esophagitis: Secondary | ICD-10-CM | POA: Diagnosis not present

## 2015-10-25 DIAGNOSIS — N183 Chronic kidney disease, stage 3 (moderate): Secondary | ICD-10-CM | POA: Diagnosis not present

## 2015-10-25 DIAGNOSIS — E1122 Type 2 diabetes mellitus with diabetic chronic kidney disease: Secondary | ICD-10-CM | POA: Diagnosis not present

## 2015-10-25 DIAGNOSIS — E782 Mixed hyperlipidemia: Secondary | ICD-10-CM | POA: Diagnosis not present

## 2015-10-31 DIAGNOSIS — E782 Mixed hyperlipidemia: Secondary | ICD-10-CM | POA: Diagnosis not present

## 2015-10-31 DIAGNOSIS — F331 Major depressive disorder, recurrent, moderate: Secondary | ICD-10-CM | POA: Diagnosis not present

## 2015-10-31 DIAGNOSIS — Z6833 Body mass index (BMI) 33.0-33.9, adult: Secondary | ICD-10-CM | POA: Diagnosis not present

## 2015-10-31 DIAGNOSIS — Z23 Encounter for immunization: Secondary | ICD-10-CM | POA: Diagnosis not present

## 2015-10-31 DIAGNOSIS — G300 Alzheimer's disease with early onset: Secondary | ICD-10-CM | POA: Diagnosis not present

## 2015-10-31 DIAGNOSIS — M1000 Idiopathic gout, unspecified site: Secondary | ICD-10-CM | POA: Diagnosis not present

## 2015-10-31 DIAGNOSIS — E6609 Other obesity due to excess calories: Secondary | ICD-10-CM | POA: Diagnosis not present

## 2015-10-31 DIAGNOSIS — E1122 Type 2 diabetes mellitus with diabetic chronic kidney disease: Secondary | ICD-10-CM | POA: Diagnosis not present

## 2015-11-05 DIAGNOSIS — R0989 Other specified symptoms and signs involving the circulatory and respiratory systems: Secondary | ICD-10-CM | POA: Diagnosis not present

## 2015-11-28 DIAGNOSIS — H35032 Hypertensive retinopathy, left eye: Secondary | ICD-10-CM | POA: Diagnosis not present

## 2015-11-28 DIAGNOSIS — H35373 Puckering of macula, bilateral: Secondary | ICD-10-CM | POA: Diagnosis not present

## 2015-11-28 DIAGNOSIS — H4060X0 Glaucoma secondary to drugs, unspecified eye, stage unspecified: Secondary | ICD-10-CM | POA: Diagnosis not present

## 2015-11-28 DIAGNOSIS — H40021 Open angle with borderline findings, high risk, right eye: Secondary | ICD-10-CM | POA: Diagnosis not present

## 2016-03-25 DIAGNOSIS — I10 Essential (primary) hypertension: Secondary | ICD-10-CM | POA: Diagnosis not present

## 2016-03-25 DIAGNOSIS — R0989 Other specified symptoms and signs involving the circulatory and respiratory systems: Secondary | ICD-10-CM | POA: Diagnosis not present

## 2016-03-25 DIAGNOSIS — N183 Chronic kidney disease, stage 3 (moderate): Secondary | ICD-10-CM | POA: Diagnosis not present

## 2016-03-25 DIAGNOSIS — F331 Major depressive disorder, recurrent, moderate: Secondary | ICD-10-CM | POA: Diagnosis not present

## 2016-03-25 DIAGNOSIS — E1122 Type 2 diabetes mellitus with diabetic chronic kidney disease: Secondary | ICD-10-CM | POA: Diagnosis not present

## 2016-03-25 DIAGNOSIS — G300 Alzheimer's disease with early onset: Secondary | ICD-10-CM | POA: Diagnosis not present

## 2016-03-25 DIAGNOSIS — I2510 Atherosclerotic heart disease of native coronary artery without angina pectoris: Secondary | ICD-10-CM | POA: Diagnosis not present

## 2016-03-25 DIAGNOSIS — Z1389 Encounter for screening for other disorder: Secondary | ICD-10-CM | POA: Diagnosis not present

## 2016-03-25 DIAGNOSIS — E6609 Other obesity due to excess calories: Secondary | ICD-10-CM | POA: Diagnosis not present

## 2016-03-25 DIAGNOSIS — J301 Allergic rhinitis due to pollen: Secondary | ICD-10-CM | POA: Diagnosis not present

## 2016-03-25 DIAGNOSIS — K219 Gastro-esophageal reflux disease without esophagitis: Secondary | ICD-10-CM | POA: Diagnosis not present

## 2016-03-25 DIAGNOSIS — M1000 Idiopathic gout, unspecified site: Secondary | ICD-10-CM | POA: Diagnosis not present

## 2016-03-25 DIAGNOSIS — E782 Mixed hyperlipidemia: Secondary | ICD-10-CM | POA: Diagnosis not present

## 2016-07-21 DIAGNOSIS — K210 Gastro-esophageal reflux disease with esophagitis: Secondary | ICD-10-CM | POA: Diagnosis not present

## 2016-07-21 DIAGNOSIS — E782 Mixed hyperlipidemia: Secondary | ICD-10-CM | POA: Diagnosis not present

## 2016-07-21 DIAGNOSIS — E1122 Type 2 diabetes mellitus with diabetic chronic kidney disease: Secondary | ICD-10-CM | POA: Diagnosis not present

## 2016-07-21 DIAGNOSIS — I10 Essential (primary) hypertension: Secondary | ICD-10-CM | POA: Diagnosis not present

## 2016-07-21 DIAGNOSIS — E119 Type 2 diabetes mellitus without complications: Secondary | ICD-10-CM | POA: Diagnosis not present

## 2016-07-24 DIAGNOSIS — F331 Major depressive disorder, recurrent, moderate: Secondary | ICD-10-CM | POA: Diagnosis not present

## 2016-07-24 DIAGNOSIS — I2510 Atherosclerotic heart disease of native coronary artery without angina pectoris: Secondary | ICD-10-CM | POA: Diagnosis not present

## 2016-07-24 DIAGNOSIS — G300 Alzheimer's disease with early onset: Secondary | ICD-10-CM | POA: Diagnosis not present

## 2016-07-24 DIAGNOSIS — E6609 Other obesity due to excess calories: Secondary | ICD-10-CM | POA: Diagnosis not present

## 2016-07-24 DIAGNOSIS — E782 Mixed hyperlipidemia: Secondary | ICD-10-CM | POA: Diagnosis not present

## 2016-07-24 DIAGNOSIS — E1122 Type 2 diabetes mellitus with diabetic chronic kidney disease: Secondary | ICD-10-CM | POA: Diagnosis not present

## 2016-07-24 DIAGNOSIS — K219 Gastro-esophageal reflux disease without esophagitis: Secondary | ICD-10-CM | POA: Diagnosis not present

## 2016-07-24 DIAGNOSIS — I10 Essential (primary) hypertension: Secondary | ICD-10-CM | POA: Diagnosis not present

## 2016-07-24 DIAGNOSIS — J301 Allergic rhinitis due to pollen: Secondary | ICD-10-CM | POA: Diagnosis not present

## 2016-07-24 DIAGNOSIS — M1000 Idiopathic gout, unspecified site: Secondary | ICD-10-CM | POA: Diagnosis not present

## 2016-07-24 DIAGNOSIS — R0989 Other specified symptoms and signs involving the circulatory and respiratory systems: Secondary | ICD-10-CM | POA: Diagnosis not present

## 2016-07-24 DIAGNOSIS — N183 Chronic kidney disease, stage 3 (moderate): Secondary | ICD-10-CM | POA: Diagnosis not present

## 2016-07-24 DIAGNOSIS — Z6832 Body mass index (BMI) 32.0-32.9, adult: Secondary | ICD-10-CM | POA: Diagnosis not present

## 2016-10-01 DIAGNOSIS — H1851 Endothelial corneal dystrophy: Secondary | ICD-10-CM | POA: Diagnosis not present

## 2016-10-01 DIAGNOSIS — H04123 Dry eye syndrome of bilateral lacrimal glands: Secondary | ICD-10-CM | POA: Diagnosis not present

## 2016-10-01 DIAGNOSIS — H4060X0 Glaucoma secondary to drugs, unspecified eye, stage unspecified: Secondary | ICD-10-CM | POA: Diagnosis not present

## 2016-10-01 DIAGNOSIS — H40021 Open angle with borderline findings, high risk, right eye: Secondary | ICD-10-CM | POA: Diagnosis not present

## 2016-11-12 DIAGNOSIS — H40052 Ocular hypertension, left eye: Secondary | ICD-10-CM | POA: Diagnosis not present

## 2016-11-12 DIAGNOSIS — H04123 Dry eye syndrome of bilateral lacrimal glands: Secondary | ICD-10-CM | POA: Diagnosis not present

## 2016-11-12 DIAGNOSIS — H40021 Open angle with borderline findings, high risk, right eye: Secondary | ICD-10-CM | POA: Diagnosis not present

## 2016-11-12 DIAGNOSIS — H401122 Primary open-angle glaucoma, left eye, moderate stage: Secondary | ICD-10-CM | POA: Diagnosis not present

## 2016-11-20 DIAGNOSIS — R0989 Other specified symptoms and signs involving the circulatory and respiratory systems: Secondary | ICD-10-CM | POA: Diagnosis not present

## 2016-11-20 DIAGNOSIS — I10 Essential (primary) hypertension: Secondary | ICD-10-CM | POA: Diagnosis not present

## 2016-11-20 DIAGNOSIS — E782 Mixed hyperlipidemia: Secondary | ICD-10-CM | POA: Diagnosis not present

## 2016-11-20 DIAGNOSIS — K210 Gastro-esophageal reflux disease with esophagitis: Secondary | ICD-10-CM | POA: Diagnosis not present

## 2016-11-20 DIAGNOSIS — N183 Chronic kidney disease, stage 3 (moderate): Secondary | ICD-10-CM | POA: Diagnosis not present

## 2016-11-20 DIAGNOSIS — E1122 Type 2 diabetes mellitus with diabetic chronic kidney disease: Secondary | ICD-10-CM | POA: Diagnosis not present

## 2016-11-20 DIAGNOSIS — H40052 Ocular hypertension, left eye: Secondary | ICD-10-CM | POA: Diagnosis not present

## 2016-11-20 DIAGNOSIS — G300 Alzheimer's disease with early onset: Secondary | ICD-10-CM | POA: Diagnosis not present

## 2016-11-20 DIAGNOSIS — H401122 Primary open-angle glaucoma, left eye, moderate stage: Secondary | ICD-10-CM | POA: Diagnosis not present

## 2016-11-25 DIAGNOSIS — Z1212 Encounter for screening for malignant neoplasm of rectum: Secondary | ICD-10-CM | POA: Diagnosis not present

## 2016-11-25 DIAGNOSIS — M1000 Idiopathic gout, unspecified site: Secondary | ICD-10-CM | POA: Diagnosis not present

## 2016-11-25 DIAGNOSIS — Z23 Encounter for immunization: Secondary | ICD-10-CM | POA: Diagnosis not present

## 2016-11-25 DIAGNOSIS — I2510 Atherosclerotic heart disease of native coronary artery without angina pectoris: Secondary | ICD-10-CM | POA: Diagnosis not present

## 2016-11-25 DIAGNOSIS — E1122 Type 2 diabetes mellitus with diabetic chronic kidney disease: Secondary | ICD-10-CM | POA: Diagnosis not present

## 2016-11-25 DIAGNOSIS — F331 Major depressive disorder, recurrent, moderate: Secondary | ICD-10-CM | POA: Diagnosis not present

## 2016-11-25 DIAGNOSIS — G300 Alzheimer's disease with early onset: Secondary | ICD-10-CM | POA: Diagnosis not present

## 2016-11-25 DIAGNOSIS — J301 Allergic rhinitis due to pollen: Secondary | ICD-10-CM | POA: Diagnosis not present

## 2016-11-25 DIAGNOSIS — Z0001 Encounter for general adult medical examination with abnormal findings: Secondary | ICD-10-CM | POA: Diagnosis not present

## 2016-11-25 DIAGNOSIS — E6609 Other obesity due to excess calories: Secondary | ICD-10-CM | POA: Diagnosis not present

## 2016-11-25 DIAGNOSIS — E782 Mixed hyperlipidemia: Secondary | ICD-10-CM | POA: Diagnosis not present

## 2016-11-25 DIAGNOSIS — I10 Essential (primary) hypertension: Secondary | ICD-10-CM | POA: Diagnosis not present

## 2016-12-31 DIAGNOSIS — H401122 Primary open-angle glaucoma, left eye, moderate stage: Secondary | ICD-10-CM | POA: Diagnosis not present

## 2016-12-31 DIAGNOSIS — H40052 Ocular hypertension, left eye: Secondary | ICD-10-CM | POA: Diagnosis not present

## 2016-12-31 DIAGNOSIS — H169 Unspecified keratitis: Secondary | ICD-10-CM | POA: Diagnosis not present

## 2016-12-31 DIAGNOSIS — H40021 Open angle with borderline findings, high risk, right eye: Secondary | ICD-10-CM | POA: Diagnosis not present

## 2017-01-29 DIAGNOSIS — H35372 Puckering of macula, left eye: Secondary | ICD-10-CM | POA: Diagnosis not present

## 2017-01-29 DIAGNOSIS — H40052 Ocular hypertension, left eye: Secondary | ICD-10-CM | POA: Diagnosis not present

## 2017-01-29 DIAGNOSIS — H401122 Primary open-angle glaucoma, left eye, moderate stage: Secondary | ICD-10-CM | POA: Diagnosis not present

## 2017-01-29 DIAGNOSIS — H40021 Open angle with borderline findings, high risk, right eye: Secondary | ICD-10-CM | POA: Diagnosis not present

## 2017-03-19 DIAGNOSIS — G300 Alzheimer's disease with early onset: Secondary | ICD-10-CM | POA: Diagnosis not present

## 2017-03-19 DIAGNOSIS — F331 Major depressive disorder, recurrent, moderate: Secondary | ICD-10-CM | POA: Diagnosis not present

## 2017-03-19 DIAGNOSIS — I10 Essential (primary) hypertension: Secondary | ICD-10-CM | POA: Diagnosis not present

## 2017-03-19 DIAGNOSIS — N183 Chronic kidney disease, stage 3 (moderate): Secondary | ICD-10-CM | POA: Diagnosis not present

## 2017-03-19 DIAGNOSIS — I2510 Atherosclerotic heart disease of native coronary artery without angina pectoris: Secondary | ICD-10-CM | POA: Diagnosis not present

## 2017-03-19 DIAGNOSIS — K210 Gastro-esophageal reflux disease with esophagitis: Secondary | ICD-10-CM | POA: Diagnosis not present

## 2017-03-19 DIAGNOSIS — E1122 Type 2 diabetes mellitus with diabetic chronic kidney disease: Secondary | ICD-10-CM | POA: Diagnosis not present

## 2017-03-19 DIAGNOSIS — E782 Mixed hyperlipidemia: Secondary | ICD-10-CM | POA: Diagnosis not present

## 2017-03-19 DIAGNOSIS — R0989 Other specified symptoms and signs involving the circulatory and respiratory systems: Secondary | ICD-10-CM | POA: Diagnosis not present

## 2017-03-24 DIAGNOSIS — I10 Essential (primary) hypertension: Secondary | ICD-10-CM | POA: Diagnosis not present

## 2017-03-24 DIAGNOSIS — E1122 Type 2 diabetes mellitus with diabetic chronic kidney disease: Secondary | ICD-10-CM | POA: Diagnosis not present

## 2017-03-24 DIAGNOSIS — E782 Mixed hyperlipidemia: Secondary | ICD-10-CM | POA: Diagnosis not present

## 2017-03-24 DIAGNOSIS — G300 Alzheimer's disease with early onset: Secondary | ICD-10-CM | POA: Diagnosis not present

## 2017-03-24 DIAGNOSIS — Z23 Encounter for immunization: Secondary | ICD-10-CM | POA: Diagnosis not present

## 2017-03-24 DIAGNOSIS — Z6833 Body mass index (BMI) 33.0-33.9, adult: Secondary | ICD-10-CM | POA: Diagnosis not present

## 2017-03-24 DIAGNOSIS — M1000 Idiopathic gout, unspecified site: Secondary | ICD-10-CM | POA: Diagnosis not present

## 2017-03-24 DIAGNOSIS — I2510 Atherosclerotic heart disease of native coronary artery without angina pectoris: Secondary | ICD-10-CM | POA: Diagnosis not present

## 2017-04-16 DIAGNOSIS — H40052 Ocular hypertension, left eye: Secondary | ICD-10-CM | POA: Diagnosis not present

## 2017-04-16 DIAGNOSIS — H401122 Primary open-angle glaucoma, left eye, moderate stage: Secondary | ICD-10-CM | POA: Diagnosis not present

## 2017-04-16 DIAGNOSIS — H40021 Open angle with borderline findings, high risk, right eye: Secondary | ICD-10-CM | POA: Diagnosis not present

## 2017-05-14 DIAGNOSIS — H40052 Ocular hypertension, left eye: Secondary | ICD-10-CM | POA: Diagnosis not present

## 2017-05-14 DIAGNOSIS — H401122 Primary open-angle glaucoma, left eye, moderate stage: Secondary | ICD-10-CM | POA: Diagnosis not present

## 2017-05-14 DIAGNOSIS — H04123 Dry eye syndrome of bilateral lacrimal glands: Secondary | ICD-10-CM | POA: Diagnosis not present

## 2017-05-14 DIAGNOSIS — H40021 Open angle with borderline findings, high risk, right eye: Secondary | ICD-10-CM | POA: Diagnosis not present

## 2017-07-15 DIAGNOSIS — H40021 Open angle with borderline findings, high risk, right eye: Secondary | ICD-10-CM | POA: Diagnosis not present

## 2017-07-15 DIAGNOSIS — H401122 Primary open-angle glaucoma, left eye, moderate stage: Secondary | ICD-10-CM | POA: Diagnosis not present

## 2017-07-15 DIAGNOSIS — H40052 Ocular hypertension, left eye: Secondary | ICD-10-CM | POA: Diagnosis not present

## 2017-07-15 DIAGNOSIS — H21562 Pupillary abnormality, left eye: Secondary | ICD-10-CM | POA: Diagnosis not present

## 2017-07-30 DIAGNOSIS — E782 Mixed hyperlipidemia: Secondary | ICD-10-CM | POA: Diagnosis not present

## 2017-07-30 DIAGNOSIS — I10 Essential (primary) hypertension: Secondary | ICD-10-CM | POA: Diagnosis not present

## 2017-07-30 DIAGNOSIS — N183 Chronic kidney disease, stage 3 (moderate): Secondary | ICD-10-CM | POA: Diagnosis not present

## 2017-07-30 DIAGNOSIS — M1000 Idiopathic gout, unspecified site: Secondary | ICD-10-CM | POA: Diagnosis not present

## 2017-07-30 DIAGNOSIS — I2510 Atherosclerotic heart disease of native coronary artery without angina pectoris: Secondary | ICD-10-CM | POA: Diagnosis not present

## 2017-07-30 DIAGNOSIS — G300 Alzheimer's disease with early onset: Secondary | ICD-10-CM | POA: Diagnosis not present

## 2017-07-30 DIAGNOSIS — E1122 Type 2 diabetes mellitus with diabetic chronic kidney disease: Secondary | ICD-10-CM | POA: Diagnosis not present

## 2017-07-30 DIAGNOSIS — Z6834 Body mass index (BMI) 34.0-34.9, adult: Secondary | ICD-10-CM | POA: Diagnosis not present

## 2017-12-03 DIAGNOSIS — K210 Gastro-esophageal reflux disease with esophagitis: Secondary | ICD-10-CM | POA: Diagnosis not present

## 2017-12-03 DIAGNOSIS — E782 Mixed hyperlipidemia: Secondary | ICD-10-CM | POA: Diagnosis not present

## 2017-12-03 DIAGNOSIS — E1122 Type 2 diabetes mellitus with diabetic chronic kidney disease: Secondary | ICD-10-CM | POA: Diagnosis not present

## 2017-12-03 DIAGNOSIS — N183 Chronic kidney disease, stage 3 (moderate): Secondary | ICD-10-CM | POA: Diagnosis not present

## 2017-12-03 DIAGNOSIS — I10 Essential (primary) hypertension: Secondary | ICD-10-CM | POA: Diagnosis not present

## 2017-12-07 DIAGNOSIS — Z1212 Encounter for screening for malignant neoplasm of rectum: Secondary | ICD-10-CM | POA: Diagnosis not present

## 2017-12-07 DIAGNOSIS — Z23 Encounter for immunization: Secondary | ICD-10-CM | POA: Diagnosis not present

## 2017-12-07 DIAGNOSIS — Z0001 Encounter for general adult medical examination with abnormal findings: Secondary | ICD-10-CM | POA: Diagnosis not present

## 2018-03-25 DIAGNOSIS — H21562 Pupillary abnormality, left eye: Secondary | ICD-10-CM | POA: Diagnosis not present

## 2018-03-25 DIAGNOSIS — H04123 Dry eye syndrome of bilateral lacrimal glands: Secondary | ICD-10-CM | POA: Diagnosis not present

## 2018-03-25 DIAGNOSIS — H40021 Open angle with borderline findings, high risk, right eye: Secondary | ICD-10-CM | POA: Diagnosis not present

## 2018-03-25 DIAGNOSIS — H401122 Primary open-angle glaucoma, left eye, moderate stage: Secondary | ICD-10-CM | POA: Diagnosis not present

## 2018-04-02 DIAGNOSIS — I10 Essential (primary) hypertension: Secondary | ICD-10-CM | POA: Diagnosis not present

## 2018-04-02 DIAGNOSIS — E7800 Pure hypercholesterolemia, unspecified: Secondary | ICD-10-CM | POA: Diagnosis not present

## 2018-05-03 DIAGNOSIS — E1165 Type 2 diabetes mellitus with hyperglycemia: Secondary | ICD-10-CM | POA: Diagnosis not present

## 2018-05-03 DIAGNOSIS — I10 Essential (primary) hypertension: Secondary | ICD-10-CM | POA: Diagnosis not present

## 2018-05-03 DIAGNOSIS — E782 Mixed hyperlipidemia: Secondary | ICD-10-CM | POA: Diagnosis not present

## 2018-06-03 DIAGNOSIS — I10 Essential (primary) hypertension: Secondary | ICD-10-CM | POA: Diagnosis not present

## 2018-06-03 DIAGNOSIS — E782 Mixed hyperlipidemia: Secondary | ICD-10-CM | POA: Diagnosis not present

## 2018-07-03 DIAGNOSIS — E1122 Type 2 diabetes mellitus with diabetic chronic kidney disease: Secondary | ICD-10-CM | POA: Diagnosis not present

## 2018-08-03 DIAGNOSIS — E1165 Type 2 diabetes mellitus with hyperglycemia: Secondary | ICD-10-CM | POA: Diagnosis not present

## 2018-08-03 DIAGNOSIS — E785 Hyperlipidemia, unspecified: Secondary | ICD-10-CM | POA: Diagnosis not present

## 2018-09-03 DIAGNOSIS — E785 Hyperlipidemia, unspecified: Secondary | ICD-10-CM | POA: Diagnosis not present

## 2018-09-03 DIAGNOSIS — I10 Essential (primary) hypertension: Secondary | ICD-10-CM | POA: Diagnosis not present

## 2018-10-04 DIAGNOSIS — E1122 Type 2 diabetes mellitus with diabetic chronic kidney disease: Secondary | ICD-10-CM | POA: Diagnosis not present

## 2018-10-04 DIAGNOSIS — E782 Mixed hyperlipidemia: Secondary | ICD-10-CM | POA: Diagnosis not present

## 2018-11-03 DIAGNOSIS — I10 Essential (primary) hypertension: Secondary | ICD-10-CM | POA: Diagnosis not present

## 2018-11-03 DIAGNOSIS — E782 Mixed hyperlipidemia: Secondary | ICD-10-CM | POA: Diagnosis not present

## 2018-11-05 DIAGNOSIS — Z7689 Persons encountering health services in other specified circumstances: Secondary | ICD-10-CM | POA: Diagnosis not present

## 2018-11-05 DIAGNOSIS — K219 Gastro-esophageal reflux disease without esophagitis: Secondary | ICD-10-CM | POA: Diagnosis not present

## 2018-11-05 DIAGNOSIS — I10 Essential (primary) hypertension: Secondary | ICD-10-CM | POA: Diagnosis not present

## 2018-11-05 DIAGNOSIS — E1165 Type 2 diabetes mellitus with hyperglycemia: Secondary | ICD-10-CM | POA: Diagnosis not present

## 2018-11-05 DIAGNOSIS — E782 Mixed hyperlipidemia: Secondary | ICD-10-CM | POA: Diagnosis not present

## 2018-11-05 DIAGNOSIS — E1122 Type 2 diabetes mellitus with diabetic chronic kidney disease: Secondary | ICD-10-CM | POA: Diagnosis not present

## 2018-11-08 DIAGNOSIS — E1122 Type 2 diabetes mellitus with diabetic chronic kidney disease: Secondary | ICD-10-CM | POA: Diagnosis not present

## 2018-11-08 DIAGNOSIS — G300 Alzheimer's disease with early onset: Secondary | ICD-10-CM | POA: Diagnosis not present

## 2018-11-08 DIAGNOSIS — E782 Mixed hyperlipidemia: Secondary | ICD-10-CM | POA: Diagnosis not present

## 2018-11-08 DIAGNOSIS — I10 Essential (primary) hypertension: Secondary | ICD-10-CM | POA: Diagnosis not present

## 2018-11-08 DIAGNOSIS — M1000 Idiopathic gout, unspecified site: Secondary | ICD-10-CM | POA: Diagnosis not present

## 2018-12-03 DIAGNOSIS — E782 Mixed hyperlipidemia: Secondary | ICD-10-CM | POA: Diagnosis not present

## 2018-12-03 DIAGNOSIS — E1122 Type 2 diabetes mellitus with diabetic chronic kidney disease: Secondary | ICD-10-CM | POA: Diagnosis not present

## 2018-12-20 DIAGNOSIS — H40021 Open angle with borderline findings, high risk, right eye: Secondary | ICD-10-CM | POA: Diagnosis not present

## 2018-12-20 DIAGNOSIS — H35373 Puckering of macula, bilateral: Secondary | ICD-10-CM | POA: Diagnosis not present

## 2018-12-20 DIAGNOSIS — H401122 Primary open-angle glaucoma, left eye, moderate stage: Secondary | ICD-10-CM | POA: Diagnosis not present

## 2018-12-20 DIAGNOSIS — H35033 Hypertensive retinopathy, bilateral: Secondary | ICD-10-CM | POA: Diagnosis not present

## 2019-02-03 DIAGNOSIS — E1122 Type 2 diabetes mellitus with diabetic chronic kidney disease: Secondary | ICD-10-CM | POA: Diagnosis not present

## 2019-02-03 DIAGNOSIS — E782 Mixed hyperlipidemia: Secondary | ICD-10-CM | POA: Diagnosis not present

## 2019-02-08 DIAGNOSIS — E1122 Type 2 diabetes mellitus with diabetic chronic kidney disease: Secondary | ICD-10-CM | POA: Diagnosis not present

## 2019-02-08 DIAGNOSIS — I10 Essential (primary) hypertension: Secondary | ICD-10-CM | POA: Diagnosis not present

## 2019-02-08 DIAGNOSIS — D649 Anemia, unspecified: Secondary | ICD-10-CM | POA: Diagnosis not present

## 2019-02-08 DIAGNOSIS — E1165 Type 2 diabetes mellitus with hyperglycemia: Secondary | ICD-10-CM | POA: Diagnosis not present

## 2019-02-08 DIAGNOSIS — R946 Abnormal results of thyroid function studies: Secondary | ICD-10-CM | POA: Diagnosis not present

## 2019-02-08 DIAGNOSIS — E782 Mixed hyperlipidemia: Secondary | ICD-10-CM | POA: Diagnosis not present

## 2019-02-15 DIAGNOSIS — I25119 Atherosclerotic heart disease of native coronary artery with unspecified angina pectoris: Secondary | ICD-10-CM | POA: Diagnosis not present

## 2019-02-15 DIAGNOSIS — Z1212 Encounter for screening for malignant neoplasm of rectum: Secondary | ICD-10-CM | POA: Diagnosis not present

## 2019-02-15 DIAGNOSIS — E1122 Type 2 diabetes mellitus with diabetic chronic kidney disease: Secondary | ICD-10-CM | POA: Diagnosis not present

## 2019-02-15 DIAGNOSIS — I10 Essential (primary) hypertension: Secondary | ICD-10-CM | POA: Diagnosis not present

## 2019-02-15 DIAGNOSIS — Z23 Encounter for immunization: Secondary | ICD-10-CM | POA: Diagnosis not present

## 2019-02-15 DIAGNOSIS — Z0001 Encounter for general adult medical examination with abnormal findings: Secondary | ICD-10-CM | POA: Diagnosis not present

## 2019-02-15 DIAGNOSIS — M1000 Idiopathic gout, unspecified site: Secondary | ICD-10-CM | POA: Diagnosis not present

## 2019-03-04 DIAGNOSIS — E1122 Type 2 diabetes mellitus with diabetic chronic kidney disease: Secondary | ICD-10-CM | POA: Diagnosis not present

## 2019-03-04 DIAGNOSIS — I10 Essential (primary) hypertension: Secondary | ICD-10-CM | POA: Diagnosis not present

## 2019-04-01 DIAGNOSIS — E7849 Other hyperlipidemia: Secondary | ICD-10-CM | POA: Diagnosis not present

## 2019-04-01 DIAGNOSIS — E1122 Type 2 diabetes mellitus with diabetic chronic kidney disease: Secondary | ICD-10-CM | POA: Diagnosis not present

## 2019-04-01 DIAGNOSIS — I129 Hypertensive chronic kidney disease with stage 1 through stage 4 chronic kidney disease, or unspecified chronic kidney disease: Secondary | ICD-10-CM | POA: Diagnosis not present

## 2019-04-01 DIAGNOSIS — N183 Chronic kidney disease, stage 3 unspecified: Secondary | ICD-10-CM | POA: Diagnosis not present

## 2019-05-04 DIAGNOSIS — I10 Essential (primary) hypertension: Secondary | ICD-10-CM | POA: Diagnosis not present

## 2019-05-04 DIAGNOSIS — E7849 Other hyperlipidemia: Secondary | ICD-10-CM | POA: Diagnosis not present

## 2019-06-03 DIAGNOSIS — I10 Essential (primary) hypertension: Secondary | ICD-10-CM | POA: Diagnosis not present

## 2019-06-27 DIAGNOSIS — R531 Weakness: Secondary | ICD-10-CM | POA: Diagnosis not present

## 2019-06-27 DIAGNOSIS — R05 Cough: Secondary | ICD-10-CM | POA: Diagnosis not present

## 2019-06-28 DIAGNOSIS — R531 Weakness: Secondary | ICD-10-CM | POA: Diagnosis not present

## 2019-06-30 DIAGNOSIS — R531 Weakness: Secondary | ICD-10-CM | POA: Diagnosis not present

## 2019-07-04 DIAGNOSIS — I129 Hypertensive chronic kidney disease with stage 1 through stage 4 chronic kidney disease, or unspecified chronic kidney disease: Secondary | ICD-10-CM | POA: Diagnosis not present

## 2019-07-04 DIAGNOSIS — N183 Chronic kidney disease, stage 3 unspecified: Secondary | ICD-10-CM | POA: Diagnosis not present

## 2019-07-04 DIAGNOSIS — E1122 Type 2 diabetes mellitus with diabetic chronic kidney disease: Secondary | ICD-10-CM | POA: Diagnosis not present

## 2019-07-06 DIAGNOSIS — G309 Alzheimer's disease, unspecified: Secondary | ICD-10-CM | POA: Diagnosis not present

## 2019-07-06 DIAGNOSIS — R531 Weakness: Secondary | ICD-10-CM | POA: Diagnosis not present

## 2019-07-15 DIAGNOSIS — E1165 Type 2 diabetes mellitus with hyperglycemia: Secondary | ICD-10-CM | POA: Diagnosis not present

## 2019-07-15 DIAGNOSIS — Z7982 Long term (current) use of aspirin: Secondary | ICD-10-CM | POA: Diagnosis not present

## 2019-07-15 DIAGNOSIS — Z9181 History of falling: Secondary | ICD-10-CM | POA: Diagnosis not present

## 2019-07-15 DIAGNOSIS — N189 Chronic kidney disease, unspecified: Secondary | ICD-10-CM | POA: Diagnosis not present

## 2019-07-15 DIAGNOSIS — Z951 Presence of aortocoronary bypass graft: Secondary | ICD-10-CM | POA: Diagnosis not present

## 2019-07-15 DIAGNOSIS — M791 Myalgia, unspecified site: Secondary | ICD-10-CM | POA: Diagnosis not present

## 2019-07-15 DIAGNOSIS — E1122 Type 2 diabetes mellitus with diabetic chronic kidney disease: Secondary | ICD-10-CM | POA: Diagnosis not present

## 2019-07-20 DIAGNOSIS — Z7982 Long term (current) use of aspirin: Secondary | ICD-10-CM | POA: Diagnosis not present

## 2019-07-20 DIAGNOSIS — M791 Myalgia, unspecified site: Secondary | ICD-10-CM | POA: Diagnosis not present

## 2019-07-20 DIAGNOSIS — E1122 Type 2 diabetes mellitus with diabetic chronic kidney disease: Secondary | ICD-10-CM | POA: Diagnosis not present

## 2019-07-20 DIAGNOSIS — N189 Chronic kidney disease, unspecified: Secondary | ICD-10-CM | POA: Diagnosis not present

## 2019-07-20 DIAGNOSIS — Z951 Presence of aortocoronary bypass graft: Secondary | ICD-10-CM | POA: Diagnosis not present

## 2019-07-20 DIAGNOSIS — Z9181 History of falling: Secondary | ICD-10-CM | POA: Diagnosis not present

## 2019-07-20 DIAGNOSIS — E1165 Type 2 diabetes mellitus with hyperglycemia: Secondary | ICD-10-CM | POA: Diagnosis not present

## 2019-07-22 DIAGNOSIS — Z9181 History of falling: Secondary | ICD-10-CM | POA: Diagnosis not present

## 2019-07-22 DIAGNOSIS — Z7982 Long term (current) use of aspirin: Secondary | ICD-10-CM | POA: Diagnosis not present

## 2019-07-22 DIAGNOSIS — M791 Myalgia, unspecified site: Secondary | ICD-10-CM | POA: Diagnosis not present

## 2019-07-22 DIAGNOSIS — N189 Chronic kidney disease, unspecified: Secondary | ICD-10-CM | POA: Diagnosis not present

## 2019-07-22 DIAGNOSIS — E1122 Type 2 diabetes mellitus with diabetic chronic kidney disease: Secondary | ICD-10-CM | POA: Diagnosis not present

## 2019-07-22 DIAGNOSIS — E1165 Type 2 diabetes mellitus with hyperglycemia: Secondary | ICD-10-CM | POA: Diagnosis not present

## 2019-07-22 DIAGNOSIS — Z951 Presence of aortocoronary bypass graft: Secondary | ICD-10-CM | POA: Diagnosis not present

## 2019-07-25 DIAGNOSIS — N189 Chronic kidney disease, unspecified: Secondary | ICD-10-CM | POA: Diagnosis not present

## 2019-07-25 DIAGNOSIS — E1165 Type 2 diabetes mellitus with hyperglycemia: Secondary | ICD-10-CM | POA: Diagnosis not present

## 2019-07-25 DIAGNOSIS — Z951 Presence of aortocoronary bypass graft: Secondary | ICD-10-CM | POA: Diagnosis not present

## 2019-07-25 DIAGNOSIS — Z9181 History of falling: Secondary | ICD-10-CM | POA: Diagnosis not present

## 2019-07-25 DIAGNOSIS — Z7982 Long term (current) use of aspirin: Secondary | ICD-10-CM | POA: Diagnosis not present

## 2019-07-25 DIAGNOSIS — M791 Myalgia, unspecified site: Secondary | ICD-10-CM | POA: Diagnosis not present

## 2019-07-25 DIAGNOSIS — E1122 Type 2 diabetes mellitus with diabetic chronic kidney disease: Secondary | ICD-10-CM | POA: Diagnosis not present

## 2019-07-27 DIAGNOSIS — M791 Myalgia, unspecified site: Secondary | ICD-10-CM | POA: Diagnosis not present

## 2019-07-27 DIAGNOSIS — E1165 Type 2 diabetes mellitus with hyperglycemia: Secondary | ICD-10-CM | POA: Diagnosis not present

## 2019-07-27 DIAGNOSIS — N189 Chronic kidney disease, unspecified: Secondary | ICD-10-CM | POA: Diagnosis not present

## 2019-07-27 DIAGNOSIS — Z7982 Long term (current) use of aspirin: Secondary | ICD-10-CM | POA: Diagnosis not present

## 2019-07-27 DIAGNOSIS — E1122 Type 2 diabetes mellitus with diabetic chronic kidney disease: Secondary | ICD-10-CM | POA: Diagnosis not present

## 2019-07-27 DIAGNOSIS — Z951 Presence of aortocoronary bypass graft: Secondary | ICD-10-CM | POA: Diagnosis not present

## 2019-07-27 DIAGNOSIS — Z9181 History of falling: Secondary | ICD-10-CM | POA: Diagnosis not present

## 2019-07-28 DIAGNOSIS — D528 Other folate deficiency anemias: Secondary | ICD-10-CM | POA: Diagnosis not present

## 2019-07-28 DIAGNOSIS — I10 Essential (primary) hypertension: Secondary | ICD-10-CM | POA: Diagnosis not present

## 2019-07-28 DIAGNOSIS — E1122 Type 2 diabetes mellitus with diabetic chronic kidney disease: Secondary | ICD-10-CM | POA: Diagnosis not present

## 2019-07-28 DIAGNOSIS — D510 Vitamin B12 deficiency anemia due to intrinsic factor deficiency: Secondary | ICD-10-CM | POA: Diagnosis not present

## 2019-07-28 DIAGNOSIS — N183 Chronic kidney disease, stage 3 unspecified: Secondary | ICD-10-CM | POA: Diagnosis not present

## 2019-07-28 DIAGNOSIS — K2100 Gastro-esophageal reflux disease with esophagitis, without bleeding: Secondary | ICD-10-CM | POA: Diagnosis not present

## 2019-07-28 DIAGNOSIS — E782 Mixed hyperlipidemia: Secondary | ICD-10-CM | POA: Diagnosis not present

## 2019-07-28 DIAGNOSIS — R5383 Other fatigue: Secondary | ICD-10-CM | POA: Diagnosis not present

## 2019-07-31 DIAGNOSIS — R531 Weakness: Secondary | ICD-10-CM | POA: Diagnosis not present

## 2019-08-01 DIAGNOSIS — E1165 Type 2 diabetes mellitus with hyperglycemia: Secondary | ICD-10-CM | POA: Diagnosis not present

## 2019-08-01 DIAGNOSIS — M791 Myalgia, unspecified site: Secondary | ICD-10-CM | POA: Diagnosis not present

## 2019-08-01 DIAGNOSIS — E1122 Type 2 diabetes mellitus with diabetic chronic kidney disease: Secondary | ICD-10-CM | POA: Diagnosis not present

## 2019-08-01 DIAGNOSIS — N189 Chronic kidney disease, unspecified: Secondary | ICD-10-CM | POA: Diagnosis not present

## 2019-08-01 DIAGNOSIS — Z951 Presence of aortocoronary bypass graft: Secondary | ICD-10-CM | POA: Diagnosis not present

## 2019-08-01 DIAGNOSIS — G300 Alzheimer's disease with early onset: Secondary | ICD-10-CM | POA: Diagnosis not present

## 2019-08-01 DIAGNOSIS — Z7982 Long term (current) use of aspirin: Secondary | ICD-10-CM | POA: Diagnosis not present

## 2019-08-01 DIAGNOSIS — I10 Essential (primary) hypertension: Secondary | ICD-10-CM | POA: Diagnosis not present

## 2019-08-01 DIAGNOSIS — K219 Gastro-esophageal reflux disease without esophagitis: Secondary | ICD-10-CM | POA: Diagnosis not present

## 2019-08-01 DIAGNOSIS — E782 Mixed hyperlipidemia: Secondary | ICD-10-CM | POA: Diagnosis not present

## 2019-08-01 DIAGNOSIS — Z9181 History of falling: Secondary | ICD-10-CM | POA: Diagnosis not present

## 2019-08-03 DIAGNOSIS — N183 Chronic kidney disease, stage 3 unspecified: Secondary | ICD-10-CM | POA: Diagnosis not present

## 2019-08-03 DIAGNOSIS — E1122 Type 2 diabetes mellitus with diabetic chronic kidney disease: Secondary | ICD-10-CM | POA: Diagnosis not present

## 2019-08-03 DIAGNOSIS — I129 Hypertensive chronic kidney disease with stage 1 through stage 4 chronic kidney disease, or unspecified chronic kidney disease: Secondary | ICD-10-CM | POA: Diagnosis not present

## 2019-08-05 DIAGNOSIS — R531 Weakness: Secondary | ICD-10-CM | POA: Diagnosis not present

## 2019-08-18 DIAGNOSIS — E1165 Type 2 diabetes mellitus with hyperglycemia: Secondary | ICD-10-CM | POA: Diagnosis not present

## 2019-08-18 DIAGNOSIS — Z7982 Long term (current) use of aspirin: Secondary | ICD-10-CM | POA: Diagnosis not present

## 2019-08-18 DIAGNOSIS — E1122 Type 2 diabetes mellitus with diabetic chronic kidney disease: Secondary | ICD-10-CM | POA: Diagnosis not present

## 2019-08-18 DIAGNOSIS — N189 Chronic kidney disease, unspecified: Secondary | ICD-10-CM | POA: Diagnosis not present

## 2019-08-18 DIAGNOSIS — M791 Myalgia, unspecified site: Secondary | ICD-10-CM | POA: Diagnosis not present

## 2019-08-30 DIAGNOSIS — R531 Weakness: Secondary | ICD-10-CM | POA: Diagnosis not present

## 2019-09-02 DIAGNOSIS — I129 Hypertensive chronic kidney disease with stage 1 through stage 4 chronic kidney disease, or unspecified chronic kidney disease: Secondary | ICD-10-CM | POA: Diagnosis not present

## 2019-09-02 DIAGNOSIS — N183 Chronic kidney disease, stage 3 unspecified: Secondary | ICD-10-CM | POA: Diagnosis not present

## 2019-09-02 DIAGNOSIS — E1122 Type 2 diabetes mellitus with diabetic chronic kidney disease: Secondary | ICD-10-CM | POA: Diagnosis not present

## 2019-09-05 DIAGNOSIS — R531 Weakness: Secondary | ICD-10-CM | POA: Diagnosis not present

## 2019-09-30 DIAGNOSIS — R531 Weakness: Secondary | ICD-10-CM | POA: Diagnosis not present

## 2019-09-30 DIAGNOSIS — G309 Alzheimer's disease, unspecified: Secondary | ICD-10-CM | POA: Diagnosis not present

## 2019-10-04 DIAGNOSIS — I129 Hypertensive chronic kidney disease with stage 1 through stage 4 chronic kidney disease, or unspecified chronic kidney disease: Secondary | ICD-10-CM | POA: Diagnosis not present

## 2019-10-04 DIAGNOSIS — E1122 Type 2 diabetes mellitus with diabetic chronic kidney disease: Secondary | ICD-10-CM | POA: Diagnosis not present

## 2019-10-04 DIAGNOSIS — N183 Chronic kidney disease, stage 3 unspecified: Secondary | ICD-10-CM | POA: Diagnosis not present

## 2019-10-06 DIAGNOSIS — R531 Weakness: Secondary | ICD-10-CM | POA: Diagnosis not present

## 2019-11-03 DIAGNOSIS — N183 Chronic kidney disease, stage 3 unspecified: Secondary | ICD-10-CM | POA: Diagnosis not present

## 2019-11-03 DIAGNOSIS — E1122 Type 2 diabetes mellitus with diabetic chronic kidney disease: Secondary | ICD-10-CM | POA: Diagnosis not present

## 2019-11-03 DIAGNOSIS — I129 Hypertensive chronic kidney disease with stage 1 through stage 4 chronic kidney disease, or unspecified chronic kidney disease: Secondary | ICD-10-CM | POA: Diagnosis not present

## 2019-11-10 DIAGNOSIS — N183 Chronic kidney disease, stage 3 unspecified: Secondary | ICD-10-CM | POA: Diagnosis not present

## 2019-11-10 DIAGNOSIS — E7800 Pure hypercholesterolemia, unspecified: Secondary | ICD-10-CM | POA: Diagnosis not present

## 2019-11-10 DIAGNOSIS — G300 Alzheimer's disease with early onset: Secondary | ICD-10-CM | POA: Diagnosis not present

## 2019-11-10 DIAGNOSIS — I10 Essential (primary) hypertension: Secondary | ICD-10-CM | POA: Diagnosis not present

## 2019-11-10 DIAGNOSIS — E1165 Type 2 diabetes mellitus with hyperglycemia: Secondary | ICD-10-CM | POA: Diagnosis not present

## 2019-11-10 DIAGNOSIS — D519 Vitamin B12 deficiency anemia, unspecified: Secondary | ICD-10-CM | POA: Diagnosis not present

## 2019-11-10 DIAGNOSIS — D529 Folate deficiency anemia, unspecified: Secondary | ICD-10-CM | POA: Diagnosis not present

## 2019-11-10 DIAGNOSIS — D649 Anemia, unspecified: Secondary | ICD-10-CM | POA: Diagnosis not present

## 2019-11-10 DIAGNOSIS — R5383 Other fatigue: Secondary | ICD-10-CM | POA: Diagnosis not present

## 2019-11-16 DIAGNOSIS — N183 Chronic kidney disease, stage 3 unspecified: Secondary | ICD-10-CM | POA: Diagnosis not present

## 2019-11-17 DIAGNOSIS — E1122 Type 2 diabetes mellitus with diabetic chronic kidney disease: Secondary | ICD-10-CM | POA: Diagnosis not present

## 2019-11-17 DIAGNOSIS — G300 Alzheimer's disease with early onset: Secondary | ICD-10-CM | POA: Diagnosis not present

## 2019-11-17 DIAGNOSIS — Z23 Encounter for immunization: Secondary | ICD-10-CM | POA: Diagnosis not present

## 2019-11-17 DIAGNOSIS — E782 Mixed hyperlipidemia: Secondary | ICD-10-CM | POA: Diagnosis not present

## 2019-11-17 DIAGNOSIS — I10 Essential (primary) hypertension: Secondary | ICD-10-CM | POA: Diagnosis not present

## 2019-12-03 DIAGNOSIS — N183 Chronic kidney disease, stage 3 unspecified: Secondary | ICD-10-CM | POA: Diagnosis not present

## 2019-12-03 DIAGNOSIS — I129 Hypertensive chronic kidney disease with stage 1 through stage 4 chronic kidney disease, or unspecified chronic kidney disease: Secondary | ICD-10-CM | POA: Diagnosis not present

## 2019-12-03 DIAGNOSIS — E1122 Type 2 diabetes mellitus with diabetic chronic kidney disease: Secondary | ICD-10-CM | POA: Diagnosis not present

## 2020-01-03 DIAGNOSIS — N183 Chronic kidney disease, stage 3 unspecified: Secondary | ICD-10-CM | POA: Diagnosis not present

## 2020-01-03 DIAGNOSIS — E1122 Type 2 diabetes mellitus with diabetic chronic kidney disease: Secondary | ICD-10-CM | POA: Diagnosis not present

## 2020-01-03 DIAGNOSIS — I129 Hypertensive chronic kidney disease with stage 1 through stage 4 chronic kidney disease, or unspecified chronic kidney disease: Secondary | ICD-10-CM | POA: Diagnosis not present

## 2020-01-04 DIAGNOSIS — Z23 Encounter for immunization: Secondary | ICD-10-CM | POA: Diagnosis not present

## 2020-02-03 DIAGNOSIS — N183 Chronic kidney disease, stage 3 unspecified: Secondary | ICD-10-CM | POA: Diagnosis not present

## 2020-02-03 DIAGNOSIS — E1122 Type 2 diabetes mellitus with diabetic chronic kidney disease: Secondary | ICD-10-CM | POA: Diagnosis not present

## 2020-02-03 DIAGNOSIS — I129 Hypertensive chronic kidney disease with stage 1 through stage 4 chronic kidney disease, or unspecified chronic kidney disease: Secondary | ICD-10-CM | POA: Diagnosis not present

## 2020-02-15 DIAGNOSIS — E1165 Type 2 diabetes mellitus with hyperglycemia: Secondary | ICD-10-CM | POA: Diagnosis not present

## 2020-02-15 DIAGNOSIS — E782 Mixed hyperlipidemia: Secondary | ICD-10-CM | POA: Diagnosis not present

## 2020-02-15 DIAGNOSIS — I10 Essential (primary) hypertension: Secondary | ICD-10-CM | POA: Diagnosis not present

## 2020-02-15 DIAGNOSIS — D649 Anemia, unspecified: Secondary | ICD-10-CM | POA: Diagnosis not present

## 2020-02-15 DIAGNOSIS — D529 Folate deficiency anemia, unspecified: Secondary | ICD-10-CM | POA: Diagnosis not present

## 2020-02-15 DIAGNOSIS — N183 Chronic kidney disease, stage 3 unspecified: Secondary | ICD-10-CM | POA: Diagnosis not present

## 2020-02-15 DIAGNOSIS — D519 Vitamin B12 deficiency anemia, unspecified: Secondary | ICD-10-CM | POA: Diagnosis not present

## 2020-02-15 DIAGNOSIS — G300 Alzheimer's disease with early onset: Secondary | ICD-10-CM | POA: Diagnosis not present

## 2020-02-15 DIAGNOSIS — E1122 Type 2 diabetes mellitus with diabetic chronic kidney disease: Secondary | ICD-10-CM | POA: Diagnosis not present

## 2020-03-03 DIAGNOSIS — E1122 Type 2 diabetes mellitus with diabetic chronic kidney disease: Secondary | ICD-10-CM | POA: Diagnosis not present

## 2020-03-03 DIAGNOSIS — I129 Hypertensive chronic kidney disease with stage 1 through stage 4 chronic kidney disease, or unspecified chronic kidney disease: Secondary | ICD-10-CM | POA: Diagnosis not present

## 2020-03-03 DIAGNOSIS — N183 Chronic kidney disease, stage 3 unspecified: Secondary | ICD-10-CM | POA: Diagnosis not present

## 2020-04-02 DIAGNOSIS — I129 Hypertensive chronic kidney disease with stage 1 through stage 4 chronic kidney disease, or unspecified chronic kidney disease: Secondary | ICD-10-CM | POA: Diagnosis not present

## 2020-04-02 DIAGNOSIS — E1122 Type 2 diabetes mellitus with diabetic chronic kidney disease: Secondary | ICD-10-CM | POA: Diagnosis not present

## 2020-04-02 DIAGNOSIS — N183 Chronic kidney disease, stage 3 unspecified: Secondary | ICD-10-CM | POA: Diagnosis not present

## 2020-05-02 DIAGNOSIS — I129 Hypertensive chronic kidney disease with stage 1 through stage 4 chronic kidney disease, or unspecified chronic kidney disease: Secondary | ICD-10-CM | POA: Diagnosis not present

## 2020-05-02 DIAGNOSIS — E1122 Type 2 diabetes mellitus with diabetic chronic kidney disease: Secondary | ICD-10-CM | POA: Diagnosis not present

## 2020-05-02 DIAGNOSIS — N183 Chronic kidney disease, stage 3 unspecified: Secondary | ICD-10-CM | POA: Diagnosis not present

## 2020-06-02 DIAGNOSIS — E1122 Type 2 diabetes mellitus with diabetic chronic kidney disease: Secondary | ICD-10-CM | POA: Diagnosis not present

## 2020-06-02 DIAGNOSIS — N183 Chronic kidney disease, stage 3 unspecified: Secondary | ICD-10-CM | POA: Diagnosis not present

## 2020-06-02 DIAGNOSIS — I129 Hypertensive chronic kidney disease with stage 1 through stage 4 chronic kidney disease, or unspecified chronic kidney disease: Secondary | ICD-10-CM | POA: Diagnosis not present

## 2020-06-22 DIAGNOSIS — Z1329 Encounter for screening for other suspected endocrine disorder: Secondary | ICD-10-CM | POA: Diagnosis not present

## 2020-06-22 DIAGNOSIS — D649 Anemia, unspecified: Secondary | ICD-10-CM | POA: Diagnosis not present

## 2020-06-22 DIAGNOSIS — R5383 Other fatigue: Secondary | ICD-10-CM | POA: Diagnosis not present

## 2020-06-22 DIAGNOSIS — E7849 Other hyperlipidemia: Secondary | ICD-10-CM | POA: Diagnosis not present

## 2020-06-22 DIAGNOSIS — E782 Mixed hyperlipidemia: Secondary | ICD-10-CM | POA: Diagnosis not present

## 2020-06-22 DIAGNOSIS — I10 Essential (primary) hypertension: Secondary | ICD-10-CM | POA: Diagnosis not present

## 2020-06-22 DIAGNOSIS — N1832 Chronic kidney disease, stage 3b: Secondary | ICD-10-CM | POA: Diagnosis not present

## 2020-06-22 DIAGNOSIS — E1122 Type 2 diabetes mellitus with diabetic chronic kidney disease: Secondary | ICD-10-CM | POA: Diagnosis not present

## 2020-06-25 DIAGNOSIS — Z23 Encounter for immunization: Secondary | ICD-10-CM | POA: Diagnosis not present

## 2020-06-25 DIAGNOSIS — E1122 Type 2 diabetes mellitus with diabetic chronic kidney disease: Secondary | ICD-10-CM | POA: Diagnosis not present

## 2020-06-25 DIAGNOSIS — M1000 Idiopathic gout, unspecified site: Secondary | ICD-10-CM | POA: Diagnosis not present

## 2020-06-25 DIAGNOSIS — G300 Alzheimer's disease with early onset: Secondary | ICD-10-CM | POA: Diagnosis not present

## 2020-06-25 DIAGNOSIS — E7849 Other hyperlipidemia: Secondary | ICD-10-CM | POA: Diagnosis not present

## 2020-06-25 DIAGNOSIS — Z0001 Encounter for general adult medical examination with abnormal findings: Secondary | ICD-10-CM | POA: Diagnosis not present

## 2020-06-25 DIAGNOSIS — I10 Essential (primary) hypertension: Secondary | ICD-10-CM | POA: Diagnosis not present

## 2020-07-02 DIAGNOSIS — E1122 Type 2 diabetes mellitus with diabetic chronic kidney disease: Secondary | ICD-10-CM | POA: Diagnosis not present

## 2020-07-02 DIAGNOSIS — I129 Hypertensive chronic kidney disease with stage 1 through stage 4 chronic kidney disease, or unspecified chronic kidney disease: Secondary | ICD-10-CM | POA: Diagnosis not present

## 2020-07-02 DIAGNOSIS — N183 Chronic kidney disease, stage 3 unspecified: Secondary | ICD-10-CM | POA: Diagnosis not present

## 2020-07-27 ENCOUNTER — Emergency Department (HOSPITAL_COMMUNITY): Payer: Medicare Other

## 2020-07-27 ENCOUNTER — Other Ambulatory Visit: Payer: Self-pay

## 2020-07-27 ENCOUNTER — Emergency Department (HOSPITAL_COMMUNITY)
Admission: EM | Admit: 2020-07-27 | Discharge: 2020-07-27 | Disposition: A | Discharge status: Home / Self Care | Payer: Medicare Other | Attending: Emergency Medicine | Admitting: Emergency Medicine

## 2020-07-27 DIAGNOSIS — I517 Cardiomegaly: Secondary | ICD-10-CM | POA: Diagnosis not present

## 2020-07-27 DIAGNOSIS — R410 Disorientation, unspecified: Secondary | ICD-10-CM | POA: Diagnosis not present

## 2020-07-27 DIAGNOSIS — R001 Bradycardia, unspecified: Secondary | ICD-10-CM | POA: Diagnosis not present

## 2020-07-27 DIAGNOSIS — N390 Urinary tract infection, site not specified: Secondary | ICD-10-CM | POA: Insufficient documentation

## 2020-07-27 DIAGNOSIS — Z79899 Other long term (current) drug therapy: Secondary | ICD-10-CM | POA: Diagnosis not present

## 2020-07-27 DIAGNOSIS — Z20822 Contact with and (suspected) exposure to covid-19: Secondary | ICD-10-CM | POA: Diagnosis not present

## 2020-07-27 DIAGNOSIS — R9431 Abnormal electrocardiogram [ECG] [EKG]: Secondary | ICD-10-CM | POA: Diagnosis not present

## 2020-07-27 DIAGNOSIS — S59901A Unspecified injury of right elbow, initial encounter: Secondary | ICD-10-CM | POA: Diagnosis present

## 2020-07-27 DIAGNOSIS — N189 Chronic kidney disease, unspecified: Secondary | ICD-10-CM | POA: Insufficient documentation

## 2020-07-27 DIAGNOSIS — S50311A Abrasion of right elbow, initial encounter: Secondary | ICD-10-CM | POA: Insufficient documentation

## 2020-07-27 DIAGNOSIS — R2243 Localized swelling, mass and lump, lower limb, bilateral: Secondary | ICD-10-CM | POA: Diagnosis not present

## 2020-07-27 DIAGNOSIS — R4182 Altered mental status, unspecified: Secondary | ICD-10-CM | POA: Insufficient documentation

## 2020-07-27 DIAGNOSIS — W19XXXA Unspecified fall, initial encounter: Secondary | ICD-10-CM | POA: Insufficient documentation

## 2020-07-27 LAB — I-STAT VENOUS BLOOD GAS, ED
Acid-Base Excess: 0 mmol/L (ref 0.0–2.0)
Bicarbonate: 23.7 mmol/L (ref 20.0–28.0)
Calcium, Ion: 1.13 mmol/L — ABNORMAL LOW (ref 1.15–1.40)
HCT: 34 % — ABNORMAL LOW (ref 39.0–52.0)
Hemoglobin: 11.6 g/dL — ABNORMAL LOW (ref 13.0–17.0)
O2 Saturation: 39 %
Potassium: 4.4 mmol/L (ref 3.5–5.1)
Sodium: 138 mmol/L (ref 135–145)
TCO2: 25 mmol/L (ref 22–32)
pCO2, Ven: 34 mmHg — ABNORMAL LOW (ref 44.0–60.0)
pH, Ven: 7.452 — ABNORMAL HIGH (ref 7.250–7.430)
pO2, Ven: 21 mmHg — CL (ref 32.0–45.0)

## 2020-07-27 LAB — COMPREHENSIVE METABOLIC PANEL
ALT: 11 U/L (ref 0–44)
AST: 28 U/L (ref 15–41)
Albumin: 3.2 g/dL — ABNORMAL LOW (ref 3.5–5.0)
Alkaline Phosphatase: 72 U/L (ref 38–126)
Anion gap: 9 (ref 5–15)
BUN: 27 mg/dL — ABNORMAL HIGH (ref 8–23)
CO2: 22 mmol/L (ref 22–32)
Calcium: 9 mg/dL (ref 8.9–10.3)
Chloride: 106 mmol/L (ref 98–111)
Creatinine, Ser: 2.05 mg/dL — ABNORMAL HIGH (ref 0.61–1.24)
GFR, Estimated: 21 mL/min — ABNORMAL LOW (ref >60)
Glucose, Bld: 112 mg/dL — ABNORMAL HIGH (ref 70–99)
Potassium: 4.8 mmol/L (ref 3.5–5.1)
Sodium: 137 mmol/L (ref 135–145)
Total Bilirubin: 1 mg/dL (ref 0.3–1.2)
Total Protein: 6.2 g/dL — ABNORMAL LOW (ref 6.5–8.1)

## 2020-07-27 LAB — CBC
HCT: 35.7 % — ABNORMAL LOW (ref 39.0–52.0)
Hemoglobin: 11.2 g/dL — ABNORMAL LOW (ref 13.0–17.0)
MCH: 30.1 pg (ref 26.0–34.0)
MCHC: 31.4 g/dL (ref 30.0–36.0)
MCV: 96 fL (ref 80.0–100.0)
Platelets: 290 10*3/uL (ref 150–400)
RBC: 3.72 MIL/uL — ABNORMAL LOW (ref 4.22–5.81)
RDW: 13.8 % (ref 11.5–15.5)
WBC: 10.2 10*3/uL (ref 4.0–10.5)
nRBC: 0 % (ref 0.0–0.2)

## 2020-07-27 LAB — I-STAT CHEM 8, ED
BUN: 27 mg/dL — ABNORMAL HIGH (ref 8–23)
Calcium, Ion: 1.12 mmol/L — ABNORMAL LOW (ref 1.15–1.40)
Chloride: 106 mmol/L (ref 98–111)
Creatinine, Ser: 2.1 mg/dL — ABNORMAL HIGH (ref 0.61–1.24)
Glucose, Bld: 111 mg/dL — ABNORMAL HIGH (ref 70–99)
HCT: 33 % — ABNORMAL LOW (ref 39.0–52.0)
Hemoglobin: 11.2 g/dL — ABNORMAL LOW (ref 13.0–17.0)
Potassium: 5 mmol/L (ref 3.5–5.1)
Sodium: 139 mmol/L (ref 135–145)
TCO2: 21 mmol/L — ABNORMAL LOW (ref 22–32)

## 2020-07-27 LAB — PROTIME-INR
INR: 1 (ref 0.8–1.2)
Prothrombin Time: 13 seconds (ref 11.4–15.2)

## 2020-07-27 LAB — CBG MONITORING, ED: Glucose-Capillary: 113 mg/dL — ABNORMAL HIGH (ref 70–99)

## 2020-07-27 LAB — RESP PANEL BY RT-PCR (FLU A&B, COVID) ARPGX2
Influenza A by PCR: NEGATIVE
Influenza B by PCR: NEGATIVE
SARS Coronavirus 2 by RT PCR: NEGATIVE

## 2020-07-27 LAB — AMMONIA: Ammonia: 29 umol/L (ref 9–35)

## 2020-07-27 LAB — APTT: aPTT: 28 seconds (ref 24–36)

## 2020-07-27 LAB — ETHANOL: Alcohol, Ethyl (B): <10 mg/dL (ref <10)

## 2020-07-27 LAB — LACTIC ACID, PLASMA: Lactic Acid, Venous: 2.4 mmol/L (ref 0.5–1.9)

## 2020-07-27 LAB — CK: Total CK: 49 U/L (ref 49–397)

## 2020-07-27 MED ORDER — SODIUM CHLORIDE 0.9 % IV SOLN: INTRAVENOUS | Status: DC

## 2020-07-27 MED ORDER — LEVOFLOXACIN 500 MG PO TABS: 500.0000 mg | ORAL_TABLET | Freq: Once | ORAL | Status: DC

## 2020-07-27 MED ORDER — LEVOFLOXACIN 750 MG PO TABS: 750.0000 mg | ORAL_TABLET | ORAL | 0 refills | Status: AC

## 2020-07-27 MED ORDER — SODIUM CHLORIDE 0.9 % IV BOLUS
1000.0000 mL | Freq: Once | INTRAVENOUS | Status: AC
Start: 2020-07-27 — End: 2020-07-27
  Administered 2020-07-27: 1000 mL via INTRAVENOUS

## 2020-07-27 MED ORDER — LEVOFLOXACIN 750 MG PO TABS
750.0000 mg | ORAL_TABLET | Freq: Once | ORAL | Status: AC
  Administered 2020-07-27: 750 mg via ORAL
  Filled 2020-07-27: qty 1

## 2020-07-27 NOTE — ED Provider Notes (Signed)
[facility] EMERGENCY DEPARTMENT Provider Note   CSN: [id number] Arrival date & time: 07/27/20  1806     History Chief Complaint  Patient presents with   Altered Mental Status   Fever   Fall    Luis Wolfe is a 80 y.o. male.   Altered Mental Status Associated symptoms: fever   Fever Fall  History is limited by patient mental status.  Level 5 caveat applies.  EMS reports the patient was [place] outside [place] shopping [place] lying on the ground.  He was sweating, was febrile at 103.4 however alert, able to follow commands but could not tell anyone what his name was, where he was, how he got there or any other identifying information. He was treated with ice packs that were placed under his arms and his groin and given approximately 500 cc of crystalloid.  EMS reports that other than fever his vital signs were within normal limits. EMS was concerned about a potential fall because he had an abrasion on his right arm.  No witnessed fall.      No past medical history on file.  There are no problems to display for this patient.   Patient has a merged chart.  I reviewed his medical history, surgical history, social history and family history and they were considered in my decision making.     No family history on file.     Home Medications Prior to Admission medications   Medication Sig Start Date End Date Taking? Authorizing Provider  buPROPion (WELLBUTRIN XL) 150 MG 24 hr tablet Take 150 mg by mouth daily. 07/13/20  Yes [provider]  Cholecalciferol (VITAMIN D3) 50 MCG (2000 UT) TABS Take 2,000 Units by mouth daily.   Yes [provider]  FLUoxetine (PROZAC) 40 MG capsule Take 40 mg by mouth every morning. 06/21/20  Yes [provider]  levofloxacin (LEVAQUIN) 750 MG tablet Take 1 tablet (750 mg total) by mouth every other day for 7 doses. 07/27/20 08/09/20 Yes Pearson Grippe, DO  lisinopril (ZESTRIL) 2.5 MG tablet Take 2.5  mg by mouth daily. 06/21/20  Yes [provider]  magnesium oxide (MAG-OX) 400 MG tablet Take 400 mg by mouth daily.   Yes [provider]  metoprolol tartrate (LOPRESSOR) 50 MG tablet Take 50 mg by mouth daily. 05/28/20  Yes [provider]  OVER THE COUNTER MEDICATION Take 1 tablet by mouth at bedtime. Sleep 3   Yes [provider]  PRESCRIPTION MEDICATION Place 1 drop into the left eye in the morning and at bedtime. Eye drop for pressure. Unsure of name   Yes [provider]    Allergies    Ibuprofen  Review of Systems   Review of Systems  Unable to perform ROS: Mental status change  Constitutional:  Positive for fever.   Physical Exam Updated Vital Signs BP (!) 134/93   Pulse 75   Temp 99.8 F (37.7 C) (Rectal)   Resp 17   Ht 5\' 7"  (1.702 m)   Wt 72.6 kg   SpO2 99%   BMI 25.06 kg/m   Physical Exam Vitals and nursing note reviewed.  Constitutional:      Appearance: He is well-developed. He is ill-appearing (chronically). He is not toxic-appearing.     Comments:  Sitting comfortably in bed  HENT:     Head: Normocephalic and atraumatic.     Mouth/Throat:     Mouth: Mucous membranes are dry.  Pharynx: Oropharynx is clear.  Eyes:     Extraocular Movements: Extraocular movements intact.     Conjunctiva/sclera: Conjunctivae normal.     Pupils: Pupils are equal.     Right eye: Pupil is round and reactive.     Left eye: Pupil is round and reactive.  Cardiovascular:     Rate and Rhythm: Regular rhythm. Bradycardia present.     Pulses:          Radial pulses are 2+ on the right side and 2+ on the left side.     Heart sounds: No murmur heard. Pulmonary:     Effort: Pulmonary effort is normal. No tachypnea or respiratory distress.     Breath sounds: Normal breath sounds.  Abdominal:     General: Abdomen is flat.     Palpations: Abdomen is soft.     Tenderness: There is no abdominal tenderness.  Musculoskeletal:      Cervical back: Full passive range of motion without pain, normal range of motion and neck supple.     Right lower leg: 1+ Pitting Edema present.     Left lower leg: 1+ Pitting Edema present.     Comments:  Moving all 4 extremities spontaneously with 5 out of 5 strength in bilateral upper and lower extremities.  Skin:    General: Skin is warm and dry.     Findings: Abrasion (R elbow) present.  Neurological:     Mental Status: He is alert. He is disoriented and confused.     GCS: GCS eye subscore is 4. GCS verbal subscore is 4. GCS motor subscore is 6.     Cranial Nerves: Cranial nerves are intact. No cranial nerve deficit, dysarthria or facial asymmetry.     Sensory: Sensation is intact.     Motor: Motor function is intact.    ED Results / Procedures / Treatments   Labs (all labs ordered are listed, but only abnormal results are displayed) Labs Reviewed  COMPREHENSIVE METABOLIC PANEL - Abnormal; Notable for the following components:      Result Value   Glucose, Bld 112 (*)    BUN 27 (*)    Creatinine, Ser 2.05 (*)    Total Protein 6.2 (*)    Albumin 3.2 (*)    GFR, Estimated 21 (*)    All other components within normal limits  CBC - Abnormal; Notable for the following components:   RBC 3.72 (*)    Hemoglobin 11.2 (*)    HCT 35.7 (*)    All other components within normal limits  LACTIC ACID, PLASMA - Abnormal; Notable for the following components:   Lactic Acid, Venous 2.4 (*)    All other components within normal limits  CBG MONITORING, ED - Abnormal; Notable for the following components:   Glucose-Capillary 113 (*)    All other components within normal limits  I-STAT CHEM 8, ED - Abnormal; Notable for the following components:   BUN 27 (*)    Creatinine, Ser 2.10 (*)    Glucose, Bld 111 (*)    Calcium, Ion 1.12 (*)    TCO2 21 (*)    Hemoglobin 11.2 (*)    HCT 33.0 (*)    All other components within normal limits  I-STAT VENOUS BLOOD GAS, ED - Abnormal; Notable for the  following components:   pH, Ven 7.452 (*)    pCO2, Ven 34.0 (*)    pO2, Ven 21.0 (*)    Calcium, Ion 1.13 (*)  HCT 34.0 (*)    Hemoglobin 11.6 (*)    All other components within normal limits  RESP PANEL BY RT-PCR (FLU A&B, COVID) ARPGX2  CULTURE, BLOOD (ROUTINE X 2)  CULTURE, BLOOD (ROUTINE X 2)  AMMONIA  ETHANOL  CK  PROTIME-INR  APTT  URINALYSIS, COMPLETE (UACMP) WITH MICROSCOPIC  RAPID URINE DRUG SCREEN, HOSP PERFORMED  DIC (DISSEMINATED INTRAVASCULAR COAGULATION)PANEL  CBG MONITORING, ED  CBG MONITORING, ED    EKG None  Radiology CT HEAD WO CONTRAST  Result Date: 07/27/2020 CLINICAL DATA:  Altered mental status. Possible fall. EXAM: CT HEAD WITHOUT CONTRAST TECHNIQUE: Contiguous axial images were obtained from the base of the skull through the vertex without intravenous contrast. COMPARISON:  None. FINDINGS: Brain: No evidence of acute infarction, hemorrhage, hydrocephalus, extra-axial collection or mass lesion/mass effect. Mild-to-moderate generalized cerebral atrophy. Moderate to severe periventricular and subcortical white matter hypodensities are nonspecific, but favored to reflect chronic microvascular ischemic changes. Vascular: Calcified atherosclerosis at the skullbase. No hyperdense vessel. Skull: Normal. Negative for fracture or focal lesion. Sinuses/Orbits: No acute finding. Other: None. IMPRESSION: 1. No acute intracranial abnormality. 2. Moderate to severe chronic microvascular ischemic changes. Electronically Signed   By: Titus Dubin M.D.   On: 07/27/2020 20:03   DG Chest Port 1 View  Result Date: 07/27/2020 CLINICAL DATA:  Altered mental status EXAM: PORTABLE CHEST 1 VIEW COMPARISON:  04/04/2010 FINDINGS: Post sternotomy changes. Cardiomegaly. Patchy airspace disease in the left retrocardiac region. No pneumothorax. IMPRESSION: 1. Cardiomegaly. 2. Patchy airspace disease in the left retrocardiac region, possible pneumonia Electronically Signed   By: Donavan Foil M.D.   On: 07/27/2020 20:27    Procedures Procedures   Medications Ordered in ED Medications  sodium chloride 0.9 % bolus 1,000 mL (0 mLs Intravenous Stopped 07/27/20 1951)    And  0.9 %  sodium chloride infusion (0 mLs Intravenous Stopped 07/27/20 2150)  levofloxacin (LEVAQUIN) tablet 750 mg (750 mg Oral Given 07/27/20 2139)    ED Course  I have reviewed the triage vital signs and the nursing notes.  Pertinent labs & imaging results that were available during my care of the patient were reviewed by me and considered in my medical decision making (see chart for details).  Clinical Course as of 07/27/20 2349  Fri Jul 27, 2020  2121 Wife arrived to bedside.  She tells me that the patient's current mental status and neurologic status are at his baseline.  Reports a history of severe Alzheimer's dementia that has been worsening recently over the course of the last 6 months.  Says that she took him to the grocery store and he walked away and got lost and eventually walked outside where he was found by EMS.  She agrees with work-up currently however reiterates that he is at his baseline mental status. [ZB]  2123 Patient wife does not want [place] to obtain UA via straight cath. Prefers to just treat with abx (requests PO)   [ZB]  2316 I discussed further workup with the patient's wife. She does not feel as though further w/u is indicated. Wants to take him home where he will be more comfortable. He has been hemodynamically stable while he was here. Will d/c on Levaquin which will cover for both UTI and PNA. I will renally dose the medication q48hr given underlying renal disease. [ZB]    Clinical Course User Index [ZB] Pearson Grippe, DO   MDM Rules/Calculators/A&P  Impression is encephalopathy of unclear origin.  However given fever and concern for infectious etiologies and broad work-up was initiated to evaluate for these.  I do not suspect sepsis at this  time. Also considering acute intracranial hemorrhage both spontaneous and traumatic. Also considering heat related illness since he was found outside in the heat confused.  Broad work-up was initiated to evaluate for etiologies of acute encephalopathy including infectious, metabolic, toxic and neurologic. CT head ordered to rule out ICH.  Though he does not meet criteria for sepsis I did draw blood cultures.  Without any apparent source at this time or history to support baseline mental status, did not order antibiotics initially. Initial treatment with IV crystalloid  See clinical course above for further medical decision-making.  CT head without any acute intracranial normalities. Evidence of renal dysfunction on metabolic panel.  No significant electrolyte abnormalities that required acute treatment.  Wife reports that he has baseline CKD. Most likely, his presentation today pain found to have a fever was likely incidental because his wife says that he has been at his baseline health status and just happened to walk away out of the grocery store and they lost him.  Wife really does not want the patient to stay in the hospital.  She does not want any further invasive work-up or treatment.  Prefers outpatient management and p.o. antibiotics which I feel is appropriate.  It is possible that he has a urinary tract infection or community-acquired pneumonia.  We will treat with a renally dosed course of Levaquin and he will follow-up outpatient.   Final Clinical Impression(s) / ED Diagnoses Final diagnoses:  Lower urinary tract infectious disease    Rx / DC Orders ED Discharge Orders          Ordered    levofloxacin (LEVAQUIN) 750 MG tablet  Every 48 hours        07/27/20 [place], [place], DO 07/27/20 2349    Varney Biles, MD 07/28/20 [phone]

## 2020-07-27 NOTE — ED Notes (Addendum)
No straight cath for urine per Dr Kathrynn Humble at bedside

## 2020-07-27 NOTE — ED Notes (Signed)
Pt has removed 3rd IV. Discussed POC with EDP. Plan to discharge. IV no replaced. IVF stopped. Pt received 1.2 L of IVF

## 2020-07-27 NOTE — ED Notes (Signed)
Pt family at bedside. States pt is normally confused at baseline.

## 2020-07-27 NOTE — ED Triage Notes (Signed)
Pt BIB EMS. Found AMS outside [place] on [address], decreased responsiveness, confused, cold sweats, and hot to touch. EMS reports T 103.4 placed ice packs and IVF 400 mL given. Suspected fall. Skin tear right arm, denies pain, unknown loc, unknown blood thinner.   On ED arrival pt able to follow commands. Disoriented to time, place, and situation

## 2020-07-27 NOTE — Discharge Instructions (Addendum)
I have prescribed you an antibiotic to treat urinary tract infection.  Please take the medication as prescribed.  He will need to take it every other day for total of 7 doses. I want you to schedule an appointment for him to be seen by his primary care doctor sometime this coming week.  If he develops worsening confusion, vomiting or change in mental status please bring him back to the emergency department.

## 2020-07-27 NOTE — ED Notes (Signed)
Pt confused re-connected to electronic vs monitoring system multiple time. Family at bedside educated in importance of staying connected without success

## 2020-08-01 LAB — CULTURE, BLOOD (ROUTINE X 2)
Culture: NO GROWTH
Culture: NO GROWTH
Special Requests: ADEQUATE
Special Requests: ADEQUATE

## 2020-08-02 DIAGNOSIS — E1122 Type 2 diabetes mellitus with diabetic chronic kidney disease: Secondary | ICD-10-CM | POA: Diagnosis not present

## 2020-08-02 DIAGNOSIS — I129 Hypertensive chronic kidney disease with stage 1 through stage 4 chronic kidney disease, or unspecified chronic kidney disease: Secondary | ICD-10-CM | POA: Diagnosis not present

## 2020-08-02 DIAGNOSIS — N183 Chronic kidney disease, stage 3 unspecified: Secondary | ICD-10-CM | POA: Diagnosis not present

## 2020-09-02 ENCOUNTER — Encounter: Payer: Self-pay | Admitting: Gastroenterology

## 2020-10-03 DIAGNOSIS — N183 Chronic kidney disease, stage 3 unspecified: Secondary | ICD-10-CM | POA: Diagnosis not present

## 2020-10-03 DIAGNOSIS — E1122 Type 2 diabetes mellitus with diabetic chronic kidney disease: Secondary | ICD-10-CM | POA: Diagnosis not present

## 2020-10-03 DIAGNOSIS — I129 Hypertensive chronic kidney disease with stage 1 through stage 4 chronic kidney disease, or unspecified chronic kidney disease: Secondary | ICD-10-CM | POA: Diagnosis not present

## 2020-10-29 DIAGNOSIS — E1122 Type 2 diabetes mellitus with diabetic chronic kidney disease: Secondary | ICD-10-CM | POA: Diagnosis not present

## 2020-10-29 DIAGNOSIS — I10 Essential (primary) hypertension: Secondary | ICD-10-CM | POA: Diagnosis not present

## 2020-10-29 DIAGNOSIS — N183 Chronic kidney disease, stage 3 unspecified: Secondary | ICD-10-CM | POA: Diagnosis not present

## 2020-11-01 DIAGNOSIS — Z23 Encounter for immunization: Secondary | ICD-10-CM | POA: Diagnosis not present

## 2020-11-01 DIAGNOSIS — I739 Peripheral vascular disease, unspecified: Secondary | ICD-10-CM | POA: Diagnosis not present

## 2020-11-01 DIAGNOSIS — G300 Alzheimer's disease with early onset: Secondary | ICD-10-CM | POA: Diagnosis not present

## 2020-11-01 DIAGNOSIS — I25119 Atherosclerotic heart disease of native coronary artery with unspecified angina pectoris: Secondary | ICD-10-CM | POA: Diagnosis not present

## 2020-11-01 DIAGNOSIS — E7849 Other hyperlipidemia: Secondary | ICD-10-CM | POA: Diagnosis not present

## 2020-11-01 DIAGNOSIS — I10 Essential (primary) hypertension: Secondary | ICD-10-CM | POA: Diagnosis not present

## 2020-11-01 DIAGNOSIS — E1122 Type 2 diabetes mellitus with diabetic chronic kidney disease: Secondary | ICD-10-CM | POA: Diagnosis not present

## 2020-11-28 DIAGNOSIS — Z23 Encounter for immunization: Secondary | ICD-10-CM | POA: Diagnosis not present

## 2020-12-26 ENCOUNTER — Encounter: Payer: Medicare Other | Admitting: Vascular Surgery

## 2020-12-26 ENCOUNTER — Other Ambulatory Visit (HOSPITAL_COMMUNITY): Payer: Self-pay | Admitting: Vascular Surgery

## 2020-12-26 DIAGNOSIS — I739 Peripheral vascular disease, unspecified: Secondary | ICD-10-CM

## 2021-02-06 ENCOUNTER — Encounter: Payer: Self-pay | Admitting: Vascular Surgery

## 2021-02-06 ENCOUNTER — Ambulatory Visit: Payer: Medicare Other

## 2021-02-06 ENCOUNTER — Other Ambulatory Visit: Payer: Self-pay

## 2021-02-06 ENCOUNTER — Ambulatory Visit: Payer: Medicare Other | Admitting: Vascular Surgery

## 2021-02-06 DIAGNOSIS — I739 Peripheral vascular disease, unspecified: Secondary | ICD-10-CM

## 2021-02-06 NOTE — Progress Notes (Signed)
[facility] [place]  Patient name: Luis Wolfe MRN: [id number] DOB: [birth year] Sex: male  REASON FOR CONSULT: Evaluation lower extremity arterial flow  HPI: Luis Wolfe is a 81 y.o. male, who is here today for evaluation.  He is here today with his wife.  He has concern regarding arterial circulation to his lower extremities.  He has had frequent falls and is having failing health overall.  His wife does most of the history.  She reports that he had a fall recently with extensive bruising in his back and buttocks.  He also has apparently had increasing confusion as well.  He does not have any arterial rest pain.  He does occasionally have abrasions on his feet and has been able to heal these.  He does not walk to the level of claudication.  Past Medical History:  Diagnosis Date   Allergy    Anxiety    Benign neoplasm of colon    Borderline diabetic    no meds, diet controlled, MD is following pt   Bursitis of shoulder    Hx - resolved    Cataract    Had surgery - bilateral   Coronary artery disease    Dental bridge present    perm upper bridge, lower bottom partial   Depression    Esophageal reflux    External hemorrhoids without mention of complication    Gout, unspecified    Hematoma    hematoma after spine surgery   Hyperlipidemia    Hypertension    Incontinence of feces    History - resolved, no current problems   Memory loss    Dementia   Nocturia    Other postprocedural MIWOEH(O12.24)    umbilical Heriorrhapy   MGNOIBBCWU(889.16)    Hx - current weight 213.4lb as of 08/16/15   Pancreatitis    hx of   Personal history of other diseases of digestive system    diverticulosis colon   Pneumonia    Hx - resolved history of hospital /ventilator-acquired pneumonia   Rhinitis    occasional   Spondylolysis     Family History  Problem Relation Age of Onset   Diabetes Mother    Heart failure Mother     Clotting disorder Father    Prostate cancer Maternal Grandfather    Colon cancer Neg Hx    Rectal cancer Neg Hx    Stomach cancer Neg Hx    Esophageal cancer Neg Hx    Pancreatic cancer Neg Hx     SOCIAL HISTORY: Social History   Socioeconomic History   Marital status: Married    Spouse name: Not on file   Number of children: Not on file   Years of education: Not on file   Highest education level: Not on file  Occupational History   Not on file  Tobacco Use   Smoking status: Never   Smokeless tobacco: Never  Substance and Sexual Activity   Alcohol use: No    Comment: Quit approx 14yrs ago   Drug use: No   Sexual activity: Never  Other Topics Concern   Not on file  Social History Narrative   Not on file   Social Determinants of Health   Financial Resource Strain: Not on file  Food Insecurity: Not on file  Transportation Needs: Not on file  Physical Activity: Not on file  Stress: Not on file  Social Connections: Not on file  Intimate Partner Violence: Not on file  Allergies  Allergen Reactions   Ibuprofen Swelling   Tamsulosin Other (See Comments)    hallucinations   Ibuprofen Swelling    Current Outpatient Medications  Medication Sig Dispense Refill   aspirin 81 MG tablet Take 325 mg by mouth daily.      buPROPion (WELLBUTRIN XL) 150 MG 24 hr tablet Take 150 mg by mouth daily.     Cholecalciferol (VITAMIN D) 2000 UNITS CAPS Take by mouth daily.     Cholecalciferol (VITAMIN D3) 50 MCG (2000 UT) TABS Take 2,000 Units by mouth daily.     colchicine-probenecid 0.5-500 MG tablet      Cyanocobalamin (VITAMIN B 12 PO) Take by mouth daily.     FLUoxetine (PROZAC) 40 MG capsule      FLUoxetine (PROZAC) 40 MG capsule Take 40 mg by mouth every morning.     lisinopril (PRINIVIL,ZESTRIL) 2.5 MG tablet Take 2.5 mg by mouth daily.     lisinopril (ZESTRIL) 2.5 MG tablet Take 2.5 mg by mouth daily.     magnesium oxide (MAG-OX) 400 MG tablet Take 400 mg by mouth daily.      metoprolol succinate (TOPROL-XL) 50 MG 24 hr tablet Take 50 mg by mouth at bedtime. Take with or immediately following a meal.     metoprolol tartrate (LOPRESSOR) 50 MG tablet Take 50 mg by mouth daily.     OVER THE COUNTER MEDICATION Take 1 tablet by mouth at bedtime. Sleep 3     PRESCRIPTION MEDICATION Place 1 drop into the left eye in the morning and at bedtime. Eye drop for pressure. Unsure of name     probenecid (BENEMID) 500 MG tablet Take 500 mg by mouth 2 (two) times daily.     ranitidine (ZANTAC) 150 MG tablet Take 150 mg by mouth daily.     simvastatin (ZOCOR) 40 MG tablet Take 40 mg by mouth every evening.     No current facility-administered medications for this visit.    REVIEW OF SYSTEMS:  [X]  denotes positive finding, [ ]  denotes negative finding Cardiac  Comments:  Chest pain or chest pressure:    Shortness of breath upon exertion:    Short of breath when lying flat:    Irregular heart rhythm:        Vascular    Pain in calf, thigh, or hip brought on by ambulation:    Pain in feet at night that wakes you up from your sleep:     Blood clot in your veins:    Leg swelling:         Pulmonary    Oxygen at home:    Productive cough:     Wheezing:         Neurologic    Sudden weakness in arms or legs:     Sudden numbness in arms or legs:     Sudden onset of difficulty speaking or slurred speech:    Temporary loss of vision in one eye:     Problems with dizziness:         Gastrointestinal    Blood in stool:     Vomited blood:         Genitourinary    Burning when urinating:     Blood in urine:        Psychiatric    Major depression:         Hematologic    Bleeding problems:    Problems with blood clotting too easily:  Skin    Rashes or ulcers:        Constitutional    Fever or chills:      PHYSICAL EXAM: There were no vitals filed for this visit.  GENERAL: The patient is a well-nourished male, in no acute distress. The vital signs are  documented above. CARDIOVASCULAR: 2+ radial and 2+ femoral pulses.  I do not palpate popliteal or distal pulses. PULMONARY: There is good air exchange  MUSCULOSKELETAL: There are no major deformities or cyanosis. NEUROLOGIC: No focal weakness or paresthesias are detected. SKIN: There are no ulcers or rashes noted.  He does have some small superficial abrasions over the toes of his left foot.  No erythema PSYCHIATRIC: The patient has a normal affect.  DATA:  Noninvasive studies reveal ankle arm index of 0.72 on the right and 0.84 on the left he has monophasic flows bilaterally.  MEDICAL ISSUES: Had long discussion with the patient and his wife.  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  I do not feel that he is having any active symptoms regarding this.  I did stress the importance of foot care and to seek attention should he develop any nonhealing ulceration.  I explained if this occurred we would be much more aggressive at evaluation and treatment of his arterial insufficiency.  He currently is not having any evidence of critical limb ischemia.  He does not have rest pain or progressive tissue loss.  They are comfortable with this plan and will see [place] again on an as-needed basis   Rosetta Posner, MD [facility] [phone] Pager [phone]  Note: Portions of this report may have been transcribed using voice recognition software.  Every effort has been made to ensure accuracy; however, inadvertent computerized transcription errors may still be present.

## 2021-02-25 DIAGNOSIS — I10 Essential (primary) hypertension: Secondary | ICD-10-CM | POA: Diagnosis not present

## 2021-02-25 DIAGNOSIS — M1000 Idiopathic gout, unspecified site: Secondary | ICD-10-CM | POA: Diagnosis not present

## 2021-02-25 DIAGNOSIS — E782 Mixed hyperlipidemia: Secondary | ICD-10-CM | POA: Diagnosis not present

## 2021-02-25 DIAGNOSIS — E7849 Other hyperlipidemia: Secondary | ICD-10-CM | POA: Diagnosis not present

## 2021-02-25 DIAGNOSIS — E1122 Type 2 diabetes mellitus with diabetic chronic kidney disease: Secondary | ICD-10-CM | POA: Diagnosis not present

## 2021-02-25 DIAGNOSIS — K219 Gastro-esophageal reflux disease without esophagitis: Secondary | ICD-10-CM | POA: Diagnosis not present

## 2021-02-25 DIAGNOSIS — R5383 Other fatigue: Secondary | ICD-10-CM | POA: Diagnosis not present

## 2021-02-28 DIAGNOSIS — I10 Essential (primary) hypertension: Secondary | ICD-10-CM | POA: Diagnosis not present

## 2021-02-28 DIAGNOSIS — I739 Peripheral vascular disease, unspecified: Secondary | ICD-10-CM | POA: Diagnosis not present

## 2021-02-28 DIAGNOSIS — E1122 Type 2 diabetes mellitus with diabetic chronic kidney disease: Secondary | ICD-10-CM | POA: Diagnosis not present

## 2021-02-28 DIAGNOSIS — D692 Other nonthrombocytopenic purpura: Secondary | ICD-10-CM | POA: Diagnosis not present

## 2021-02-28 DIAGNOSIS — G300 Alzheimer's disease with early onset: Secondary | ICD-10-CM | POA: Diagnosis not present

## 2021-02-28 DIAGNOSIS — M1000 Idiopathic gout, unspecified site: Secondary | ICD-10-CM | POA: Diagnosis not present

## 2021-02-28 DIAGNOSIS — E7849 Other hyperlipidemia: Secondary | ICD-10-CM | POA: Diagnosis not present

## 2021-02-28 DIAGNOSIS — I25119 Atherosclerotic heart disease of native coronary artery with unspecified angina pectoris: Secondary | ICD-10-CM | POA: Diagnosis not present

## 2021-03-05 DIAGNOSIS — R6889 Other general symptoms and signs: Secondary | ICD-10-CM | POA: Diagnosis not present

## 2021-03-05 DIAGNOSIS — I10 Essential (primary) hypertension: Secondary | ICD-10-CM | POA: Diagnosis not present

## 2021-03-05 DIAGNOSIS — Z7982 Long term (current) use of aspirin: Secondary | ICD-10-CM | POA: Diagnosis not present

## 2021-03-05 DIAGNOSIS — R059 Cough, unspecified: Secondary | ICD-10-CM | POA: Diagnosis not present

## 2021-03-05 DIAGNOSIS — R404 Transient alteration of awareness: Secondary | ICD-10-CM | POA: Diagnosis not present

## 2021-03-05 DIAGNOSIS — Z79899 Other long term (current) drug therapy: Secondary | ICD-10-CM | POA: Diagnosis not present

## 2021-03-05 DIAGNOSIS — Z743 Need for continuous supervision: Secondary | ICD-10-CM | POA: Diagnosis not present

## 2021-03-05 DIAGNOSIS — I499 Cardiac arrhythmia, unspecified: Secondary | ICD-10-CM | POA: Diagnosis not present

## 2021-03-05 DIAGNOSIS — R569 Unspecified convulsions: Secondary | ICD-10-CM | POA: Diagnosis not present

## 2021-03-08 DIAGNOSIS — D692 Other nonthrombocytopenic purpura: Secondary | ICD-10-CM | POA: Diagnosis not present

## 2021-03-08 DIAGNOSIS — E7849 Other hyperlipidemia: Secondary | ICD-10-CM | POA: Diagnosis not present

## 2021-03-08 DIAGNOSIS — E1122 Type 2 diabetes mellitus with diabetic chronic kidney disease: Secondary | ICD-10-CM | POA: Diagnosis not present

## 2021-03-08 DIAGNOSIS — I10 Essential (primary) hypertension: Secondary | ICD-10-CM | POA: Diagnosis not present

## 2021-03-08 DIAGNOSIS — I25119 Atherosclerotic heart disease of native coronary artery with unspecified angina pectoris: Secondary | ICD-10-CM | POA: Diagnosis not present

## 2021-03-08 DIAGNOSIS — R0989 Other specified symptoms and signs involving the circulatory and respiratory systems: Secondary | ICD-10-CM | POA: Diagnosis not present

## 2021-03-08 DIAGNOSIS — G300 Alzheimer's disease with early onset: Secondary | ICD-10-CM | POA: Diagnosis not present

## 2021-03-22 DIAGNOSIS — I10 Essential (primary) hypertension: Secondary | ICD-10-CM | POA: Diagnosis not present

## 2021-04-15 ENCOUNTER — Inpatient Hospital Stay (HOSPITAL_COMMUNITY)
Admission: EM | Admit: 2021-04-15 | Discharge: 2021-04-21 | DRG: 871 | Disposition: A | Discharge status: Hospice | Payer: Medicare Other | Attending: Family Medicine | Admitting: Family Medicine

## 2021-04-15 ENCOUNTER — Emergency Department (HOSPITAL_COMMUNITY): Payer: Medicare Other

## 2021-04-15 DIAGNOSIS — Z20822 Contact with and (suspected) exposure to covid-19: Secondary | ICD-10-CM | POA: Diagnosis not present

## 2021-04-15 DIAGNOSIS — Z66 Do not resuscitate: Secondary | ICD-10-CM | POA: Diagnosis not present

## 2021-04-15 DIAGNOSIS — Z952 Presence of prosthetic heart valve: Secondary | ICD-10-CM

## 2021-04-15 DIAGNOSIS — I482 Chronic atrial fibrillation, unspecified: Secondary | ICD-10-CM | POA: Diagnosis not present

## 2021-04-15 DIAGNOSIS — R4020 Unspecified coma: Secondary | ICD-10-CM | POA: Diagnosis not present

## 2021-04-15 DIAGNOSIS — Z515 Encounter for palliative care: Secondary | ICD-10-CM

## 2021-04-15 DIAGNOSIS — J9690 Respiratory failure, unspecified, unspecified whether with hypoxia or hypercapnia: Secondary | ICD-10-CM | POA: Diagnosis not present

## 2021-04-15 DIAGNOSIS — E876 Hypokalemia: Secondary | ICD-10-CM | POA: Diagnosis present

## 2021-04-15 DIAGNOSIS — F02C0 Dementia in other diseases classified elsewhere, severe, without behavioral disturbance, psychotic disturbance, mood disturbance, and anxiety: Secondary | ICD-10-CM | POA: Diagnosis not present

## 2021-04-15 DIAGNOSIS — R404 Transient alteration of awareness: Secondary | ICD-10-CM | POA: Diagnosis not present

## 2021-04-15 DIAGNOSIS — Z888 Allergy status to other drugs, medicaments and biological substances status: Secondary | ICD-10-CM

## 2021-04-15 DIAGNOSIS — F02C11 Dementia in other diseases classified elsewhere, severe, with agitation: Secondary | ICD-10-CM | POA: Diagnosis not present

## 2021-04-15 DIAGNOSIS — J9601 Acute respiratory failure with hypoxia: Secondary | ICD-10-CM | POA: Diagnosis not present

## 2021-04-15 DIAGNOSIS — Z9049 Acquired absence of other specified parts of digestive tract: Secondary | ICD-10-CM

## 2021-04-15 DIAGNOSIS — N179 Acute kidney failure, unspecified: Secondary | ICD-10-CM | POA: Diagnosis present

## 2021-04-15 DIAGNOSIS — A419 Sepsis, unspecified organism: Secondary | ICD-10-CM | POA: Diagnosis not present

## 2021-04-15 DIAGNOSIS — R54 Age-related physical debility: Secondary | ICD-10-CM | POA: Diagnosis present

## 2021-04-15 DIAGNOSIS — R638 Other symptoms and signs concerning food and fluid intake: Secondary | ICD-10-CM | POA: Diagnosis not present

## 2021-04-15 DIAGNOSIS — Z87898 Personal history of other specified conditions: Secondary | ICD-10-CM

## 2021-04-15 DIAGNOSIS — E785 Hyperlipidemia, unspecified: Secondary | ICD-10-CM | POA: Diagnosis not present

## 2021-04-15 DIAGNOSIS — J9600 Acute respiratory failure, unspecified whether with hypoxia or hypercapnia: Secondary | ICD-10-CM | POA: Diagnosis not present

## 2021-04-15 DIAGNOSIS — I2510 Atherosclerotic heart disease of native coronary artery without angina pectoris: Secondary | ICD-10-CM | POA: Diagnosis not present

## 2021-04-15 DIAGNOSIS — R6889 Other general symptoms and signs: Secondary | ICD-10-CM | POA: Diagnosis not present

## 2021-04-15 DIAGNOSIS — Z7189 Other specified counseling: Secondary | ICD-10-CM | POA: Diagnosis not present

## 2021-04-15 DIAGNOSIS — G9341 Metabolic encephalopathy: Secondary | ICD-10-CM | POA: Diagnosis present

## 2021-04-15 DIAGNOSIS — G309 Alzheimer's disease, unspecified: Secondary | ICD-10-CM | POA: Diagnosis not present

## 2021-04-15 DIAGNOSIS — I129 Hypertensive chronic kidney disease with stage 1 through stage 4 chronic kidney disease, or unspecified chronic kidney disease: Secondary | ICD-10-CM | POA: Diagnosis present

## 2021-04-15 DIAGNOSIS — K219 Gastro-esophageal reflux disease without esophagitis: Secondary | ICD-10-CM | POA: Diagnosis present

## 2021-04-15 DIAGNOSIS — R6520 Severe sepsis without septic shock: Secondary | ICD-10-CM | POA: Diagnosis not present

## 2021-04-15 DIAGNOSIS — F32A Depression, unspecified: Secondary | ICD-10-CM | POA: Diagnosis present

## 2021-04-15 DIAGNOSIS — R000 Tachycardia, unspecified: Secondary | ICD-10-CM | POA: Diagnosis not present

## 2021-04-15 DIAGNOSIS — N184 Chronic kidney disease, stage 4 (severe): Secondary | ICD-10-CM | POA: Diagnosis present

## 2021-04-15 DIAGNOSIS — F0283 Dementia in other diseases classified elsewhere, unspecified severity, with mood disturbance: Secondary | ICD-10-CM | POA: Diagnosis not present

## 2021-04-15 DIAGNOSIS — E1122 Type 2 diabetes mellitus with diabetic chronic kidney disease: Secondary | ICD-10-CM | POA: Diagnosis not present

## 2021-04-15 DIAGNOSIS — Z981 Arthrodesis status: Secondary | ICD-10-CM

## 2021-04-15 DIAGNOSIS — Z86010 Personal history of colonic polyps: Secondary | ICD-10-CM

## 2021-04-15 DIAGNOSIS — R410 Disorientation, unspecified: Secondary | ICD-10-CM | POA: Diagnosis not present

## 2021-04-15 DIAGNOSIS — R21 Rash and other nonspecific skin eruption: Secondary | ICD-10-CM | POA: Diagnosis not present

## 2021-04-15 DIAGNOSIS — F418 Other specified anxiety disorders: Secondary | ICD-10-CM | POA: Diagnosis present

## 2021-04-15 DIAGNOSIS — I469 Cardiac arrest, cause unspecified: Secondary | ICD-10-CM | POA: Diagnosis not present

## 2021-04-15 DIAGNOSIS — F0284 Dementia in other diseases classified elsewhere, unspecified severity, with anxiety: Secondary | ICD-10-CM | POA: Diagnosis present

## 2021-04-15 DIAGNOSIS — J189 Pneumonia, unspecified organism: Secondary | ICD-10-CM | POA: Diagnosis present

## 2021-04-15 DIAGNOSIS — Z7982 Long term (current) use of aspirin: Secondary | ICD-10-CM

## 2021-04-15 DIAGNOSIS — I10 Essential (primary) hypertension: Secondary | ICD-10-CM | POA: Diagnosis present

## 2021-04-15 DIAGNOSIS — Z79899 Other long term (current) drug therapy: Secondary | ICD-10-CM

## 2021-04-15 DIAGNOSIS — Z743 Need for continuous supervision: Secondary | ICD-10-CM | POA: Diagnosis not present

## 2021-04-15 DIAGNOSIS — Z886 Allergy status to analgesic agent status: Secondary | ICD-10-CM

## 2021-04-15 DIAGNOSIS — I499 Cardiac arrhythmia, unspecified: Secondary | ICD-10-CM | POA: Diagnosis not present

## 2021-04-15 DIAGNOSIS — Z4659 Encounter for fitting and adjustment of other gastrointestinal appliance and device: Secondary | ICD-10-CM

## 2021-04-15 LAB — I-STAT CHEM 8, ED
BUN: 38 mg/dL — ABNORMAL HIGH (ref 8–23)
Calcium, Ion: 1.18 mmol/L (ref 1.15–1.40)
Chloride: 103 mmol/L (ref 98–111)
Creatinine, Ser: 2.4 mg/dL — ABNORMAL HIGH (ref 0.61–1.24)
Glucose, Bld: 138 mg/dL — ABNORMAL HIGH (ref 70–99)
HCT: 36 % — ABNORMAL LOW (ref 39.0–52.0)
Hemoglobin: 12.2 g/dL — ABNORMAL LOW (ref 13.0–17.0)
Potassium: 5 mmol/L (ref 3.5–5.1)
Sodium: 135 mmol/L (ref 135–145)
TCO2: 22 mmol/L (ref 22–32)

## 2021-04-15 LAB — CBC WITH DIFFERENTIAL/PLATELET
Abs Immature Granulocytes: 0.12 10*3/uL — ABNORMAL HIGH (ref 0.00–0.07)
Basophils Absolute: 0 10*3/uL (ref 0.0–0.1)
Basophils Relative: 0 %
Eosinophils Absolute: 0 10*3/uL (ref 0.0–0.5)
Eosinophils Relative: 0 %
HCT: 36.4 % — ABNORMAL LOW (ref 39.0–52.0)
Hemoglobin: 11.5 g/dL — ABNORMAL LOW (ref 13.0–17.0)
Immature Granulocytes: 1 %
Lymphocytes Relative: 3 %
Lymphs Abs: 0.6 10*3/uL — ABNORMAL LOW (ref 0.7–4.0)
MCH: 29.3 pg (ref 26.0–34.0)
MCHC: 31.6 g/dL (ref 30.0–36.0)
MCV: 92.9 fL (ref 80.0–100.0)
Monocytes Absolute: 1.5 10*3/uL — ABNORMAL HIGH (ref 0.1–1.0)
Monocytes Relative: 8 %
Neutro Abs: 16.8 10*3/uL — ABNORMAL HIGH (ref 1.7–7.7)
Neutrophils Relative %: 88 %
Platelets: 371 10*3/uL (ref 150–400)
RBC: 3.92 MIL/uL — ABNORMAL LOW (ref 4.22–5.81)
RDW: 13.2 % (ref 11.5–15.5)
WBC: 19.1 10*3/uL — ABNORMAL HIGH (ref 4.0–10.5)
nRBC: 0 % (ref 0.0–0.2)

## 2021-04-15 LAB — I-STAT VENOUS BLOOD GAS, ED
Acid-base deficit: 4 mmol/L — ABNORMAL HIGH (ref 0.0–2.0)
Bicarbonate: 22.2 mmol/L (ref 20.0–28.0)
Calcium, Ion: 1.17 mmol/L (ref 1.15–1.40)
HCT: 36 % — ABNORMAL LOW (ref 39.0–52.0)
Hemoglobin: 12.2 g/dL — ABNORMAL LOW (ref 13.0–17.0)
O2 Saturation: 99 %
Potassium: 5 mmol/L (ref 3.5–5.1)
Sodium: 135 mmol/L (ref 135–145)
TCO2: 24 mmol/L (ref 22–32)
pCO2, Ven: 45.9 mmHg (ref 44–60)
pH, Ven: 7.293 (ref 7.25–7.43)
pO2, Ven: 157 mmHg — ABNORMAL HIGH (ref 32–45)

## 2021-04-15 LAB — I-STAT ARTERIAL BLOOD GAS, ED
Acid-base deficit: 5 mmol/L — ABNORMAL HIGH (ref 0.0–2.0)
Bicarbonate: 20.6 mmol/L (ref 20.0–28.0)
Calcium, Ion: 1.21 mmol/L (ref 1.15–1.40)
HCT: 34 % — ABNORMAL LOW (ref 39.0–52.0)
Hemoglobin: 11.6 g/dL — ABNORMAL LOW (ref 13.0–17.0)
O2 Saturation: 100 %
Patient temperature: 102.5
Potassium: 4.9 mmol/L (ref 3.5–5.1)
Sodium: 134 mmol/L — ABNORMAL LOW (ref 135–145)
TCO2: 22 mmol/L (ref 22–32)
pCO2 arterial: 42.2 mmHg (ref 32–48)
pH, Arterial: 7.307 — ABNORMAL LOW (ref 7.35–7.45)
pO2, Arterial: 237 mmHg — ABNORMAL HIGH (ref 83–108)

## 2021-04-15 LAB — RESP PANEL BY RT-PCR (FLU A&B, COVID) ARPGX2
Influenza A by PCR: NEGATIVE
Influenza B by PCR: NEGATIVE
SARS Coronavirus 2 by RT PCR: NEGATIVE

## 2021-04-15 LAB — CBG MONITORING, ED: Glucose-Capillary: 134 mg/dL — ABNORMAL HIGH (ref 70–99)

## 2021-04-15 LAB — AMMONIA: Ammonia: 27 umol/L (ref 9–35)

## 2021-04-15 LAB — LACTIC ACID, PLASMA: Lactic Acid, Venous: 2 mmol/L (ref 0.5–1.9)

## 2021-04-15 MED ORDER — VANCOMYCIN HCL 1500 MG/300ML IV SOLN
1500.0000 mg | Freq: Once | INTRAVENOUS | Status: AC
  Administered 2021-04-16: 1500 mg via INTRAVENOUS
  Filled 2021-04-15: qty 300

## 2021-04-15 MED ORDER — SODIUM CHLORIDE 0.9 % IV BOLUS (SEPSIS)
1000.0000 mL | Freq: Once | INTRAVENOUS | Status: AC
  Administered 2021-04-16: 1000 mL via INTRAVENOUS

## 2021-04-15 MED ORDER — METRONIDAZOLE 500 MG/100ML IV SOLN
500.0000 mg | Freq: Once | INTRAVENOUS | Status: AC
  Administered 2021-04-16: 500 mg via INTRAVENOUS
  Filled 2021-04-15: qty 100

## 2021-04-15 MED ORDER — LACTATED RINGERS IV SOLN
INTRAVENOUS | Status: DC
  Administered 2021-04-16: 150 mL/h via INTRAVENOUS

## 2021-04-15 MED ORDER — VANCOMYCIN HCL IN DEXTROSE 1-5 GM/200ML-% IV SOLN: 1000.0000 mg | Freq: Once | INTRAVENOUS | Status: DC

## 2021-04-15 MED ORDER — ACETAMINOPHEN 650 MG RE SUPP
650.0000 mg | Freq: Once | RECTAL | Status: AC
  Administered 2021-04-16: 650 mg via RECTAL
  Filled 2021-04-15: qty 1

## 2021-04-15 MED ORDER — SODIUM CHLORIDE 0.9 % IV BOLUS
1000.0000 mL | Freq: Once | INTRAVENOUS | Status: AC
  Administered 2021-04-16: 1000 mL via INTRAVENOUS

## 2021-04-15 MED ORDER — ETOMIDATE 2 MG/ML IV SOLN
INTRAVENOUS | Status: AC | PRN
Start: 2021-04-15 — End: 2021-04-15
  Administered 2021-04-15: 20 mg via INTRAVENOUS

## 2021-04-15 MED ORDER — SUCCINYLCHOLINE CHLORIDE 20 MG/ML IJ SOLN
INTRAMUSCULAR | Status: AC | PRN
  Administered 2021-04-15: 120 mg via INTRAVENOUS

## 2021-04-15 MED ORDER — FENTANYL CITRATE PF 50 MCG/ML IJ SOSY: 25.0000 ug | PREFILLED_SYRINGE | INTRAMUSCULAR | Status: DC | PRN

## 2021-04-15 MED ORDER — SODIUM CHLORIDE 0.9 % IV SOLN
2.0000 g | Freq: Once | INTRAVENOUS | Status: AC
  Administered 2021-04-16: 2 g via INTRAVENOUS
  Filled 2021-04-15: qty 2

## 2021-04-15 MED ORDER — FENTANYL CITRATE PF 50 MCG/ML IJ SOSY
25.0000 ug | PREFILLED_SYRINGE | INTRAMUSCULAR | Status: DC | PRN
  Administered 2021-04-16: 25 ug via INTRAVENOUS
  Filled 2021-04-15: qty 1

## 2021-04-15 NOTE — Code Documentation (Signed)
ETT placed by Messick MD. 7.5 ETT placed, 23 @ lips ?

## 2021-04-15 NOTE — ED Provider Notes (Signed)
[facility] EMERGENCY DEPARTMENT Provider Note   CSN: [id number] Arrival date & time: 04/15/21  2240     History  Chief Complaint  Patient presents with   unresponsive    Luis Wolfe is a 81 y.o. male.  81 year old male with prior medical history as detailed below presents for evaluation.  Patient arrives from home by EMS.  Patient apparently was on the floor and unresponsive per family.  Patient without reported fall.  Patient apparently prefers to sleep on the floor.  EMS reports that patient was hypoxic.  Patient required intermittent BVM assisted ventilation during transport.  Patient is full code per report.  Patient with history of Alzheimer's.  Patient is unable to provide additional history  The history is provided by the patient, the EMS personnel and medical records.  Illness Location:  Unresponsive, hypoxic, hypotensive, Severity:  Severe Onset quality:  Unable to specify Timing:  Unable to specify Progression:  Unable to specify     Home Medications Prior to Admission medications   Medication Sig Start Date End Date Taking? Authorizing Provider  aspirin 81 MG tablet Take 325 mg by mouth daily.     [provider]  buPROPion (WELLBUTRIN XL) 150 MG 24 hr tablet Take 150 mg by mouth daily. 07/13/20   [provider]  Cholecalciferol (VITAMIN D) 2000 UNITS CAPS Take by mouth daily.    [provider]  Cholecalciferol (VITAMIN D3) 50 MCG (2000 UT) TABS Take 2,000 Units by mouth daily.    [provider]  colchicine-probenecid 0.5-500 MG tablet  05/24/14   [provider]  Cyanocobalamin (VITAMIN B 12 PO) Take by mouth daily.    [provider]  FLUoxetine (PROZAC) 40 MG capsule  06/26/14   [provider]  FLUoxetine (PROZAC) 40 MG capsule Take 40 mg by mouth every morning. 06/21/20   [provider]  lisinopril (PRINIVIL,ZESTRIL) 2.5 MG tablet Take 2.5 mg by mouth daily.     [provider]  lisinopril (ZESTRIL) 2.5 MG tablet Take 2.5 mg by mouth daily. 06/21/20   [provider]  magnesium oxide (MAG-OX) 400 MG tablet Take 400 mg by mouth daily.    [provider]  metoprolol succinate (TOPROL-XL) 50 MG 24 hr tablet Take 50 mg by mouth at bedtime. Take with or immediately following a meal.    [provider]  metoprolol tartrate (LOPRESSOR) 50 MG tablet Take 50 mg by mouth daily. 05/28/20   [provider]  OVER THE COUNTER MEDICATION Take 1 tablet by mouth at bedtime. Sleep 3    [provider]  PRESCRIPTION MEDICATION Place 1 drop into the left eye in the morning and at bedtime. Eye drop for pressure. Unsure of name    [provider]  probenecid (BENEMID) 500 MG tablet Take 500 mg by mouth 2 (two) times daily.    [provider]  ranitidine (ZANTAC) 150 MG tablet Take 150 mg by mouth daily.    [provider]  simvastatin (ZOCOR) 40 MG tablet Take 40 mg by mouth every evening.    [provider]      Allergies    Ibuprofen, Tamsulosin, and Ibuprofen    Review of Systems   Review of Systems  Unable to perform ROS: Acuity of condition   Physical Exam Updated Vital Signs BP 123/69    Pulse 90    Temp (!) 102.5 F (39.2 C) (Axillary)    Resp 18  SpO2 100%  Physical Exam Vitals and nursing note reviewed.  Constitutional:      General: He is not in acute distress.    Appearance: He is well-developed.     Comments: Ill in appearance, BVM assisted ventilation intermittently with EMS  Patient is not answering questions.  He does not localize to painful stimuli  Patient is initially hypotensive with a blood pressure of approximately 90 systolic  Febrile  HENT:     Head: Normocephalic and atraumatic.  Eyes:     Conjunctiva/sclera: Conjunctivae normal.     Pupils: Pupils are equal, round, and reactive to light.  Cardiovascular:     Rate and Rhythm: Regular rhythm.  Tachycardia present.     Heart sounds: Normal heart sounds.  Pulmonary:     Effort: No respiratory distress.     Comments: Assisted ventilations with BVM  Impending respiratory failure  Coarse breath sounds bilaterally with significant secretions in the upper airway Abdominal:     General: There is no distension.     Palpations: Abdomen is soft.     Tenderness: There is no abdominal tenderness.  Musculoskeletal:        General: No deformity. Normal range of motion.     Cervical back: Normal range of motion and neck supple.  Skin:    General: Skin is warm and dry.  Neurological:     Comments: Unresponsive, impending respiratory failure  Does not localize to painful stimuli    ED Results / Procedures / Treatments   Labs (all labs ordered are listed, but only abnormal results are displayed) Labs Reviewed  CBC WITH DIFFERENTIAL/PLATELET - Abnormal; Notable for the following components:      Result Value   WBC 19.1 (*)    RBC 3.92 (*)    Hemoglobin 11.5 (*)    HCT 36.4 (*)    Neutro Abs 16.8 (*)    Lymphs Abs 0.6 (*)    Monocytes Absolute 1.5 (*)    Abs Immature Granulocytes 0.12 (*)    All other components within normal limits  I-STAT VENOUS BLOOD GAS, ED - Abnormal; Notable for the following components:   pO2, Ven 157 (*)    Acid-base deficit 4.0 (*)    HCT 36.0 (*)    Hemoglobin 12.2 (*)    All other components within normal limits  I-STAT CHEM 8, ED - Abnormal; Notable for the following components:   BUN 38 (*)    Creatinine, Ser 2.40 (*)    Glucose, Bld 138 (*)    Hemoglobin 12.2 (*)    HCT 36.0 (*)    All other components within normal limits  CBG MONITORING, ED - Abnormal; Notable for the following components:   Glucose-Capillary 134 (*)    All other components within normal limits  I-STAT ARTERIAL BLOOD GAS, ED - Abnormal; Notable for the following components:   pH, Arterial 7.307 (*)    pO2, Arterial 237 (*)    Acid-base deficit 5.0 (*)    Sodium 134 (*)     HCT 34.0 (*)    Hemoglobin 11.6 (*)    All other components within normal limits  RESP PANEL BY RT-PCR (FLU A&B, COVID) ARPGX2  CULTURE, BLOOD (ROUTINE X 2)  CULTURE, BLOOD (ROUTINE X 2)  URINE CULTURE  LACTIC ACID, PLASMA  LACTIC ACID, PLASMA  COMPREHENSIVE METABOLIC PANEL  URINALYSIS, ROUTINE W REFLEX MICROSCOPIC  AMMONIA  PROTIME-INR  APTT    EKG EKG Interpretation  Date/Time:  Monday April 15 2021  22:43:36 EDT Ventricular Rate:  106 PR Interval:    QRS Duration: 125 QT Interval:  349 QTC Calculation: 464 R Axis:   72 Text Interpretation: Atrial flutter with predominant 2:1 AV block IVCD, consider atypical RBBB Probable left ventricular hypertrophy Inferior infarct, age indeterminate Confirmed by Dene Gentry [phone]) on 04/15/2021 10:54:46 PM  Radiology DG Chest Port 1 View  Result Date: 04/15/2021 CLINICAL DATA:  Sepsis EXAM: PORTABLE CHEST 1 VIEW COMPARISON:  07/27/2020 FINDINGS: Single frontal view of the chest demonstrates endotracheal tube overlying tracheal air column, tip approximately 2.2 cm above carina. Enteric catheter passes below diaphragm, tip excluded by collimation but side port projecting over the gastric fundus. Postsurgical changes from CABG. Cardiac silhouette is stable. Mild chronic central vascular congestion. Increased density in the retrocardiac region could reflect atelectasis or airspace disease. No effusion or pneumothorax. No acute bony abnormalities. IMPRESSION: 1. Support devices as above. 2. Retrocardiac density compatible with atelectasis or airspace disease. Minimal change since prior exam. 3. Mild chronic central vascular congestion. Electronically Signed   By: Randa Ngo M.D.   On: 04/15/2021 23:13    Procedures Procedure Name: Intubation Date/Time: 04/15/2021 11:50 PM Performed by: Valarie Merino, MD Pre-anesthesia Checklist: Patient identified, Patient being monitored, Emergency Drugs available, Timeout performed and Suction  available Oxygen Delivery Method: Ambu bag Preoxygenation: Pre-oxygenation with 100% oxygen Induction Type: Rapid sequence Ventilation: Mask ventilation without difficulty Laryngoscope Size: Glidescope Grade View: Grade I Tube size: 7.5 mm Number of attempts: 1 Placement Confirmation: ETT inserted through vocal cords under direct vision, CO2 detector and Breath sounds checked- equal and bilateral       Medications Ordered in ED Medications  lactated ringers infusion (has no administration in time range)  ceFEPIme (MAXIPIME) 2 g in sodium chloride 0.9 % 100 mL IVPB (has no administration in time range)  metroNIDAZOLE (FLAGYL) IVPB 500 mg (has no administration in time range)  sodium chloride 0.9 % bolus 1,000 mL (has no administration in time range)  fentaNYL (SUBLIMAZE) injection 25 mcg (has no administration in time range)  fentaNYL (SUBLIMAZE) injection 25-100 mcg (has no administration in time range)  vancomycin (VANCOREADY) IVPB 1500 mg/300 mL (has no administration in time range)  sodium chloride 0.9 % bolus 1,000 mL (has no administration in time range)  acetaminophen (TYLENOL) suppository 650 mg (has no administration in time range)  etomidate (AMIDATE) injection (20 mg Intravenous Given 04/15/21 2247)  succinylcholine (ANECTINE) injection (120 mg Intravenous Given 04/15/21 2248)    ED Course/ Medical Decision Making/ A&P                           Medical Decision Making Risk OTC drugs. Prescription drug management.    Medical Screen Complete  This patient presented to the ED with complaint of impending respiratory failure, fever, altered mental status.  This complaint involves an extensive number of treatment options. The initial differential diagnosis includes, but is not limited to, infection, respiratory failure, hypercarbia, pneumonia, intracranial event, etc.  This presentation is: Acute, Chronic, Self-Limited, Previously Undiagnosed, Uncertain Prognosis,  Complicated, Systemic Symptoms, and Threat to Life/Bodily Function  Patient is presenting with clear evidence of impending respiratory failure.  Patient intubated on arrival  Patient noted to be febrile.  Broad-spectrum antibiotics initiated.  Events leading to patient's presentation are unclear.  Patient will require admission.  Critical care is aware of case and will evaluate for admission.  Additional history obtained:  Additional history obtained from EMS  External records from outside sources obtained and reviewed including prior ED visits and prior Inpatient records.    Lab Tests:  I ordered and personally interpreted labs.  The pertinent results include: CBC CMP, lactic, cultures, COVID, flu   Imaging Studies ordered:  I ordered imaging studies including chest x-ray, head CT I independently visualized and interpreted obtained imaging which showed postintubation, possible retrocardiac opacity I agree with the radiologist interpretation.   Cardiac Monitoring:  The patient was maintained on a cardiac monitor.  I personally viewed and interpreted the cardiac monitor which showed an underlying rhythm of: Sinus tachycardia   Medicines ordered:  I ordered medication including RSI medication for intubation Reevaluation of the patient after these medicines showed that the patient: stayed the same     Critical Interventions:  Intubation for respiratory failure   Consultations Obtained:  I consulted critical care, and discussed lab and imaging findings as well as pertinent plan of care.    Problem List / ED Course:  Respiratory failure, fever, AMS   Reevaluation:  After the interventions noted above, I reevaluated the patient and found that they have: stayed the same   Disposition:  After consideration of the diagnostic results and the patients response to treatment, I feel that the patent would benefit from admission.     CRITICAL CARE Performed by:  Valarie Merino   Total critical care time: 45 minutes  Critical care time was exclusive of separately billable procedures and treating other patients.  Critical care was necessary to treat or prevent imminent or life-threatening deterioration.  Critical care was time spent personally by me on the following activities: development of treatment plan with patient and/or surrogate as well as nursing, discussions with consultants, evaluation of patient's response to treatment, examination of patient, obtaining history from patient or surrogate, ordering and performing treatments and interventions, ordering and review of laboratory studies, ordering and review of radiographic studies, pulse oximetry and re-evaluation of patient's condition.        Final Clinical Impression(s) / ED Diagnoses Final diagnoses:  Acute respiratory failure, unspecified whether with hypoxia or hypercapnia [facility])    Rx / DC Orders ED Discharge Orders     None         Valarie Merino, MD 04/15/21 2351

## 2021-04-15 NOTE — H&P (Incomplete)
NAME:  Luis Wolfe, MRN:  [id number], DOB:  1941-01-21, LOS: 0 ADMISSION DATE:  04/15/2021, CONSULTATION DATE:  04/15/2021 REFERRING MD: Valarie Merino, MD, CHIEF COMPLAINT:  respiratory failure   History of Present Illness:  Luis Wolfe is a 81 y.o. man with multiple medical issues including HTN, HLD, Type 2 DM, Alzheimer's dementia. Was found down at home by wife for unknown amount of time. History is limited obtained largely from bedside providers and chart review.   Pertinent  Medical History  HTN HLD Type 2 DM Alzheimer's Dementia  Significant Hospital Events: Including procedures, antibiotic start and stop dates in addition to other pertinent events   3/13 brought into ED with decreased responsiveness and Intubated for AMS and respiratory failure  Interim History / Subjective:    Objective   Blood pressure 123/69, pulse 90, temperature (!) 102.5 F (39.2 C), temperature source Axillary, resp. rate 18, SpO2 100 %.    Vent Mode: PRVC FiO2 (%):  [100 %] 100 % Set Rate:  [18 bmp] 18 bmp Vt Set:  [550 mL] 550 mL PEEP:  [5 cmH20] 5 cmH20 Plateau Pressure:  [20 cmH20] 20 cmH20  No intake or output data in the 24 hours ending 04/15/21 2332 There were no vitals filed for this visit.  Examination: General: *** HENT: *** Lungs: *** Cardiovascular: *** Abdomen: *** Extremities: *** Neuro: *** GU: ***  Labs and imaging reviewed: ABG with pH 7.30 PCO2 42 PO2 237  Na 135 K 5.0 Cr 2.4 BUN 38  CBC shows WBC 19.1 with left shift Hgb 11.5  Blood cultures pending  Chest xray 3/13 shows sternotomy wires, ETT and OG tube in appropriate position, possible retrocardiac density  Resolved Hospital Problem list   ***  Assessment & Plan:  Luis Wolfe is a 81 y.o. man who presents with:  Acute hypoxemic respiratory failure Intubated for respiratory failure/encephalopathy   Severe Sepsis Leukocytosis, encephalopathy, febrile to 102.5 on admission Possible PNA  based on CXR Blood ending Ordering UA with reflux to culture Empiric abx vanc/cefepime   Acute metabolic encephalopathy Underlying dementia with acute illness related delirium  AKI on CKD Stage IV Gentle IVF hydration  Best Practice (right click and "Reselect all SmartList Selections" daily)   Diet/type: NPO w/ oral meds DVT prophylaxis: prophylactic heparin  GI prophylaxis: H2B Lines: N/A Foley:  Yes, and it is still needed Code Status:  full code Last date of multidisciplinary goals of care discussion [***]  Labs   CBC: Recent Labs  Lab 04/15/21 2312  HGB 11.6*  HCT 34.0*    Basic Metabolic Panel: Recent Labs  Lab 04/15/21 2312  NA 134*  K 4.9   GFR: CrCl cannot be calculated (Patient's most recent lab result is older than the maximum 21 days allowed.). No results for input(s): PROCALCITON, WBC, LATICACIDVEN in the last 168 hours.  Liver Function Tests: No results for input(s): AST, ALT, ALKPHOS, BILITOT, PROT, ALBUMIN in the last 168 hours. No results for input(s): LIPASE, AMYLASE in the last 168 hours. No results for input(s): AMMONIA in the last 168 hours.  ABG    Component Value Date/Time   PHART 7.307 (L) 04/15/2021 2312   PCO2ART 42.2 04/15/2021 2312   PO2ART 237 (H) 04/15/2021 2312   HCO3 20.6 04/15/2021 2312   TCO2 22 04/15/2021 2312   ACIDBASEDEF 5.0 (H) 04/15/2021 2312   O2SAT 100 04/15/2021 2312     Coagulation Profile: No results for input(s): INR, PROTIME in the last  168 hours.  Cardiac Enzymes: No results for input(s): CKTOTAL, CKMB, CKMBINDEX, TROPONINI in the last 168 hours.  HbA1C: No results found for: HGBA1C  CBG: Recent Labs  Lab 04/15/21 2304  GLUCAP 134*    Review of Systems:   Unable to obtain  Past Medical History:  He,  has a past medical history of Allergy, Anxiety, Benign neoplasm of colon, Borderline diabetic, Bursitis of shoulder, Cataract, Coronary artery disease, Dental bridge present, Depression,  Esophageal reflux, External hemorrhoids without mention of complication, Gout, unspecified, Hematoma, Hyperlipidemia, Hypertension, Incontinence of feces, Memory loss, Nocturia, Other postprocedural status(V45.89), Overweight(278.02), Pancreatitis, Personal history of other diseases of digestive system, Pneumonia, Rhinitis, and Spondylolysis.   Surgical History:   Past Surgical History:  Procedure Laterality Date   CARDIAC VALVE SURGERY  11/2013   Bypass and replacement of valves   CARPAL TUNNEL RELEASE Left    cataract surgery Bilateral    CERVICAL SPINE SURGERY  2005   c 6 spondylolysis   CHOLECYSTECTOMY     COLONOSCOPY     polyps- [facility]   cornary angioplasty     x 2   POLYPECTOMY     SPINE SURGERY  2005   C3-C6 fusion   TONSILLECTOMY     UMBILICAL HERNIA REPAIR     UPPER GASTROINTESTINAL ENDOSCOPY     UPPER GI ENDOSCOPY  1984     Social History:   reports that he has never smoked. He has never used smokeless tobacco. He reports that he does not drink alcohol and does not use drugs.   Family History:  His family history includes Clotting disorder in his father; Diabetes in his mother; Heart failure in his mother; Prostate cancer in his maternal grandfather. There is no history of Colon cancer, Rectal cancer, Stomach cancer, Esophageal cancer, or Pancreatic cancer.   Allergies Allergies  Allergen Reactions   Ibuprofen Swelling   Tamsulosin Other (See Comments)    hallucinations   Ibuprofen Swelling     Home Medications  Prior to Admission medications   Medication Sig Start Date End Date Taking? Authorizing Provider  aspirin 81 MG tablet Take 325 mg by mouth daily.     [provider]  buPROPion (WELLBUTRIN XL) 150 MG 24 hr tablet Take 150 mg by mouth daily. 07/13/20   [provider]  Cholecalciferol (VITAMIN D) 2000 UNITS CAPS Take by mouth daily.    [provider]  Cholecalciferol (VITAMIN D3) 50 MCG (2000 UT) TABS Take 2,000 Units by  mouth daily.    [provider]  colchicine-probenecid 0.5-500 MG tablet  05/24/14   [provider]  Cyanocobalamin (VITAMIN B 12 PO) Take by mouth daily.    [provider]  FLUoxetine (PROZAC) 40 MG capsule  06/26/14   [provider]  FLUoxetine (PROZAC) 40 MG capsule Take 40 mg by mouth every morning. 06/21/20   [provider]  lisinopril (PRINIVIL,ZESTRIL) 2.5 MG tablet Take 2.5 mg by mouth daily.    [provider]  lisinopril (ZESTRIL) 2.5 MG tablet Take 2.5 mg by mouth daily. 06/21/20   [provider]  magnesium oxide (MAG-OX) 400 MG tablet Take 400 mg by mouth daily.    [provider]  metoprolol succinate (TOPROL-XL) 50 MG 24 hr tablet Take 50 mg by mouth at bedtime. Take with or immediately following a meal.    [provider]  metoprolol tartrate (LOPRESSOR) 50 MG tablet Take 50 mg by mouth daily. 05/28/20   [provider]  OVER THE COUNTER MEDICATION Take 1 tablet by mouth at bedtime. Sleep 3    [provider]  PRESCRIPTION MEDICATION Place 1 drop into the left eye in the morning and at bedtime. Eye drop for pressure. Unsure of name    [provider]  probenecid (BENEMID) 500 MG tablet Take 500 mg by mouth 2 (two) times daily.    [provider]  ranitidine (ZANTAC) 150 MG tablet Take 150 mg by mouth daily.    [provider]  simvastatin (ZOCOR) 40 MG tablet Take 40 mg by mouth every evening.    [provider]     Critical care time: ***

## 2021-04-15 NOTE — Code Documentation (Signed)
16 OG placed by Renaye Rakers RN ?

## 2021-04-15 NOTE — ED Triage Notes (Signed)
Pt bib [facility] from home. Wife called out for pt being unresponsive, unknown downtime. Pt was found on the floor which is normal for the pt. It is his preferred sleeping place. O2 with fire on room air was 80%. O2 while bagging pt was 97%.Cardiac hx, previous stent placement. Pt hx Alzheimer. 0.'4mg'$  Nitro given by EMS. ? ?EMS vitals ?80/40 BP prior to fluid bolus (818HU) --> 314 systolic ?970 CBG ?97% O2 Ambu ?16g Rt forearm ?

## 2021-04-16 ENCOUNTER — Emergency Department (HOSPITAL_COMMUNITY): Payer: Medicare Other

## 2021-04-16 ENCOUNTER — Inpatient Hospital Stay (HOSPITAL_COMMUNITY): Payer: Medicare Other

## 2021-04-16 DIAGNOSIS — F0284 Dementia in other diseases classified elsewhere, unspecified severity, with anxiety: Secondary | ICD-10-CM | POA: Diagnosis present

## 2021-04-16 DIAGNOSIS — F02C0 Dementia in other diseases classified elsewhere, severe, without behavioral disturbance, psychotic disturbance, mood disturbance, and anxiety: Secondary | ICD-10-CM | POA: Diagnosis not present

## 2021-04-16 DIAGNOSIS — Z20822 Contact with and (suspected) exposure to covid-19: Secondary | ICD-10-CM | POA: Diagnosis present

## 2021-04-16 DIAGNOSIS — G9341 Metabolic encephalopathy: Secondary | ICD-10-CM | POA: Diagnosis present

## 2021-04-16 DIAGNOSIS — K219 Gastro-esophageal reflux disease without esophagitis: Secondary | ICD-10-CM | POA: Diagnosis present

## 2021-04-16 DIAGNOSIS — J9690 Respiratory failure, unspecified, unspecified whether with hypoxia or hypercapnia: Secondary | ICD-10-CM | POA: Diagnosis present

## 2021-04-16 DIAGNOSIS — F32A Depression, unspecified: Secondary | ICD-10-CM | POA: Diagnosis present

## 2021-04-16 DIAGNOSIS — Z7189 Other specified counseling: Secondary | ICD-10-CM | POA: Diagnosis not present

## 2021-04-16 DIAGNOSIS — I469 Cardiac arrest, cause unspecified: Secondary | ICD-10-CM | POA: Diagnosis not present

## 2021-04-16 DIAGNOSIS — N179 Acute kidney failure, unspecified: Secondary | ICD-10-CM | POA: Diagnosis present

## 2021-04-16 DIAGNOSIS — F0283 Dementia in other diseases classified elsewhere, unspecified severity, with mood disturbance: Secondary | ICD-10-CM | POA: Diagnosis present

## 2021-04-16 DIAGNOSIS — I482 Chronic atrial fibrillation, unspecified: Secondary | ICD-10-CM | POA: Diagnosis present

## 2021-04-16 DIAGNOSIS — R638 Other symptoms and signs concerning food and fluid intake: Secondary | ICD-10-CM | POA: Diagnosis not present

## 2021-04-16 DIAGNOSIS — R6889 Other general symptoms and signs: Secondary | ICD-10-CM | POA: Diagnosis not present

## 2021-04-16 DIAGNOSIS — E1122 Type 2 diabetes mellitus with diabetic chronic kidney disease: Secondary | ICD-10-CM | POA: Diagnosis present

## 2021-04-16 DIAGNOSIS — Z4682 Encounter for fitting and adjustment of non-vascular catheter: Secondary | ICD-10-CM | POA: Diagnosis not present

## 2021-04-16 DIAGNOSIS — N184 Chronic kidney disease, stage 4 (severe): Secondary | ICD-10-CM | POA: Diagnosis present

## 2021-04-16 DIAGNOSIS — J189 Pneumonia, unspecified organism: Secondary | ICD-10-CM | POA: Diagnosis present

## 2021-04-16 DIAGNOSIS — E785 Hyperlipidemia, unspecified: Secondary | ICD-10-CM | POA: Diagnosis present

## 2021-04-16 DIAGNOSIS — E876 Hypokalemia: Secondary | ICD-10-CM | POA: Diagnosis present

## 2021-04-16 DIAGNOSIS — R54 Age-related physical debility: Secondary | ICD-10-CM | POA: Diagnosis present

## 2021-04-16 DIAGNOSIS — Z952 Presence of prosthetic heart valve: Secondary | ICD-10-CM | POA: Diagnosis not present

## 2021-04-16 DIAGNOSIS — R21 Rash and other nonspecific skin eruption: Secondary | ICD-10-CM | POA: Diagnosis present

## 2021-04-16 DIAGNOSIS — Z7401 Bed confinement status: Secondary | ICD-10-CM | POA: Diagnosis not present

## 2021-04-16 DIAGNOSIS — I129 Hypertensive chronic kidney disease with stage 1 through stage 4 chronic kidney disease, or unspecified chronic kidney disease: Secondary | ICD-10-CM | POA: Diagnosis present

## 2021-04-16 DIAGNOSIS — I2510 Atherosclerotic heart disease of native coronary artery without angina pectoris: Secondary | ICD-10-CM | POA: Diagnosis present

## 2021-04-16 DIAGNOSIS — G309 Alzheimer's disease, unspecified: Secondary | ICD-10-CM | POA: Diagnosis not present

## 2021-04-16 DIAGNOSIS — A419 Sepsis, unspecified organism: Secondary | ICD-10-CM | POA: Diagnosis not present

## 2021-04-16 DIAGNOSIS — R410 Disorientation, unspecified: Secondary | ICD-10-CM | POA: Diagnosis not present

## 2021-04-16 DIAGNOSIS — Z515 Encounter for palliative care: Secondary | ICD-10-CM | POA: Diagnosis not present

## 2021-04-16 DIAGNOSIS — R6520 Severe sepsis without septic shock: Secondary | ICD-10-CM | POA: Diagnosis present

## 2021-04-16 DIAGNOSIS — J9601 Acute respiratory failure with hypoxia: Secondary | ICD-10-CM | POA: Diagnosis not present

## 2021-04-16 DIAGNOSIS — Z66 Do not resuscitate: Secondary | ICD-10-CM | POA: Diagnosis not present

## 2021-04-16 LAB — URINALYSIS, ROUTINE W REFLEX MICROSCOPIC
Bilirubin Urine: NEGATIVE
Glucose, UA: NEGATIVE mg/dL
Ketones, ur: NEGATIVE mg/dL
Leukocytes,Ua: NEGATIVE
Nitrite: NEGATIVE
Protein, ur: 100 mg/dL — AB
RBC / HPF: >50 RBC/hpf — ABNORMAL HIGH (ref 0–5)
Specific Gravity, Urine: 1.017 (ref 1.005–1.030)
pH: 5 (ref 5.0–8.0)

## 2021-04-16 LAB — ECHOCARDIOGRAM COMPLETE
AR max vel: 1.03 cm2
AV Area VTI: 1.11 cm2
AV Area mean vel: 0.99 cm2
AV Mean grad: 16.2 mmHg
AV Peak grad: 29.2 mmHg
Ao pk vel: 2.7 m/s
Area-P 1/2: 7.16 cm2
Calc EF: 15.1 %
Height: 67 in
MV M vel: 4.32 m/s
MV Peak grad: 74.6 mmHg
P 1/2 time: 415 msec
Radius: 0.2 cm
S' Lateral: 4.7 cm
Single Plane A2C EF: 9.1 %
Single Plane A4C EF: 23.6 %
Weight: 2564.39 oz

## 2021-04-16 LAB — BASIC METABOLIC PANEL
Anion gap: 11 (ref 5–15)
BUN: 38 mg/dL — ABNORMAL HIGH (ref 8–23)
CO2: 13 mmol/L — ABNORMAL LOW (ref 22–32)
Calcium: 7.9 mg/dL — ABNORMAL LOW (ref 8.9–10.3)
Chloride: 110 mmol/L (ref 98–111)
Creatinine, Ser: 2.12 mg/dL — ABNORMAL HIGH (ref 0.61–1.24)
GFR, Estimated: 31 mL/min — ABNORMAL LOW (ref >60)
Glucose, Bld: 123 mg/dL — ABNORMAL HIGH (ref 70–99)
Potassium: 4.2 mmol/L (ref 3.5–5.1)
Sodium: 134 mmol/L — ABNORMAL LOW (ref 135–145)

## 2021-04-16 LAB — COMPREHENSIVE METABOLIC PANEL
ALT: 228 U/L — ABNORMAL HIGH (ref 0–44)
AST: 354 U/L — ABNORMAL HIGH (ref 15–41)
Albumin: 3.2 g/dL — ABNORMAL LOW (ref 3.5–5.0)
Alkaline Phosphatase: 58 U/L (ref 38–126)
Anion gap: 10 (ref 5–15)
BUN: 36 mg/dL — ABNORMAL HIGH (ref 8–23)
CO2: 21 mmol/L — ABNORMAL LOW (ref 22–32)
Calcium: 8.6 mg/dL — ABNORMAL LOW (ref 8.9–10.3)
Chloride: 102 mmol/L (ref 98–111)
Creatinine, Ser: 2.43 mg/dL — ABNORMAL HIGH (ref 0.61–1.24)
GFR, Estimated: 26 mL/min — ABNORMAL LOW (ref >60)
Glucose, Bld: 138 mg/dL — ABNORMAL HIGH (ref 70–99)
Potassium: 5 mmol/L (ref 3.5–5.1)
Sodium: 133 mmol/L — ABNORMAL LOW (ref 135–145)
Total Bilirubin: 0.9 mg/dL (ref 0.3–1.2)
Total Protein: 6.4 g/dL — ABNORMAL LOW (ref 6.5–8.1)

## 2021-04-16 LAB — MRSA NEXT GEN BY PCR, NASAL: MRSA by PCR Next Gen: NOT DETECTED

## 2021-04-16 LAB — GLUCOSE, CAPILLARY
Glucose-Capillary: 116 mg/dL — ABNORMAL HIGH (ref 70–99)
Glucose-Capillary: 107 mg/dL — ABNORMAL HIGH (ref 70–99)
Glucose-Capillary: 103 mg/dL — ABNORMAL HIGH (ref 70–99)
Glucose-Capillary: 113 mg/dL — ABNORMAL HIGH (ref 70–99)
Glucose-Capillary: 116 mg/dL — ABNORMAL HIGH (ref 70–99)
Glucose-Capillary: 114 mg/dL — ABNORMAL HIGH (ref 70–99)
Glucose-Capillary: 92 mg/dL (ref 70–99)

## 2021-04-16 LAB — CBC
HCT: 34.3 % — ABNORMAL LOW (ref 39.0–52.0)
Hemoglobin: 10.5 g/dL — ABNORMAL LOW (ref 13.0–17.0)
MCH: 29.1 pg (ref 26.0–34.0)
MCHC: 30.6 g/dL (ref 30.0–36.0)
MCV: 95 fL (ref 80.0–100.0)
Platelets: 213 10*3/uL (ref 150–400)
RBC: 3.61 MIL/uL — ABNORMAL LOW (ref 4.22–5.81)
RDW: 13.2 % (ref 11.5–15.5)
WBC: 12.3 10*3/uL — ABNORMAL HIGH (ref 4.0–10.5)
nRBC: 0 % (ref 0.0–0.2)

## 2021-04-16 LAB — LACTIC ACID, PLASMA: Lactic Acid, Venous: 2.3 mmol/L (ref 0.5–1.9)

## 2021-04-16 LAB — PHOSPHORUS: Phosphorus: 3.3 mg/dL (ref 2.5–4.6)

## 2021-04-16 LAB — PROTIME-INR
INR: 1.3 — ABNORMAL HIGH (ref 0.8–1.2)
Prothrombin Time: 16.6 seconds — ABNORMAL HIGH (ref 11.4–15.2)

## 2021-04-16 LAB — APTT: aPTT: 32 seconds (ref 24–36)

## 2021-04-16 LAB — MAGNESIUM: Magnesium: 2.1 mg/dL (ref 1.7–2.4)

## 2021-04-16 MED ORDER — ACETAMINOPHEN 160 MG/5ML PO SOLN: 650.0000 mg | ORAL | Status: DC | PRN

## 2021-04-16 MED ORDER — POLYETHYLENE GLYCOL 3350 17 G PO PACK: 17.0000 g | PACK | Freq: Every day | ORAL | Status: DC | PRN

## 2021-04-16 MED ORDER — FAMOTIDINE 40 MG/5ML PO SUSR
20.0000 mg | Freq: Every day | ORAL | Status: DC
  Administered 2021-04-16 – 2021-04-17 (×2): 20 mg
  Filled 2021-04-16 (×2): qty 2.5

## 2021-04-16 MED ORDER — SODIUM CHLORIDE 0.9 % IV SOLN: 2.0000 g | INTRAVENOUS | Status: DC

## 2021-04-16 MED ORDER — FLUOXETINE HCL 10 MG PO CAPS
40.0000 mg | ORAL_CAPSULE | Freq: Every day | ORAL | Status: DC
  Administered 2021-04-16 – 2021-04-17 (×2): 40 mg
  Filled 2021-04-16 (×2): qty 4

## 2021-04-16 MED ORDER — METOPROLOL TARTRATE 25 MG PO TABS
25.0000 mg | ORAL_TABLET | Freq: Two times a day (BID) | ORAL | Status: DC
  Administered 2021-04-16 – 2021-04-17 (×3): 25 mg
  Filled 2021-04-16 (×3): qty 1

## 2021-04-16 MED ORDER — HEPARIN SODIUM (PORCINE) 5000 UNIT/ML IJ SOLN
5000.0000 [IU] | Freq: Three times a day (TID) | INTRAMUSCULAR | Status: DC
  Administered 2021-04-16 – 2021-04-20 (×13): 5000 [IU] via SUBCUTANEOUS
  Filled 2021-04-16 (×13): qty 1

## 2021-04-16 MED ORDER — BUSPIRONE HCL 15 MG PO TABS
15.0000 mg | ORAL_TABLET | Freq: Two times a day (BID) | ORAL | Status: DC
  Administered 2021-04-16 – 2021-04-17 (×3): 15 mg
  Filled 2021-04-16 (×3): qty 1

## 2021-04-16 MED ORDER — SODIUM CHLORIDE 0.9 % IV SOLN: INTRAVENOUS | Status: DC | PRN

## 2021-04-16 MED ORDER — PERFLUTREN LIPID MICROSPHERE
1.0000 mL | INTRAVENOUS | Status: AC | PRN
  Administered 2021-04-16: 2 mL via INTRAVENOUS
  Filled 2021-04-16: qty 10

## 2021-04-16 MED ORDER — LEVETIRACETAM IN NACL 500 MG/100ML IV SOLN
500.0000 mg | Freq: Two times a day (BID) | INTRAVENOUS | Status: DC
  Administered 2021-04-16: 500 mg via INTRAVENOUS
  Filled 2021-04-16: qty 100

## 2021-04-16 MED ORDER — VANCOMYCIN HCL 1250 MG/250ML IV SOLN: 1250.0000 mg | INTRAVENOUS | Status: DC

## 2021-04-16 MED ORDER — SODIUM CHLORIDE 0.9 % IV SOLN
2.0000 g | INTRAVENOUS | Status: AC
  Administered 2021-04-16 – 2021-04-20 (×5): 2 g via INTRAVENOUS
  Filled 2021-04-16 (×5): qty 20

## 2021-04-16 MED ORDER — HYDRALAZINE HCL 20 MG/ML IJ SOLN
5.0000 mg | INTRAMUSCULAR | Status: DC | PRN
  Administered 2021-04-16: 5 mg via INTRAVENOUS
  Filled 2021-04-16: qty 1

## 2021-04-16 MED ORDER — HYDRALAZINE HCL 20 MG/ML IJ SOLN
5.0000 mg | INTRAMUSCULAR | Status: DC | PRN
  Administered 2021-04-16: 5 mg via INTRAVENOUS
  Administered 2021-04-17 (×2): 10 mg via INTRAVENOUS
  Filled 2021-04-16 (×3): qty 1

## 2021-04-16 MED ORDER — CHLORHEXIDINE GLUCONATE CLOTH 2 % EX PADS
6.0000 | MEDICATED_PAD | Freq: Every day | CUTANEOUS | Status: DC
  Administered 2021-04-16 – 2021-04-19 (×5): 6 via TOPICAL

## 2021-04-16 MED ORDER — DOCUSATE SODIUM 50 MG/5ML PO LIQD: 100.0000 mg | Freq: Two times a day (BID) | ORAL | Status: DC | PRN

## 2021-04-16 MED ORDER — LEVETIRACETAM 100 MG/ML PO SOLN
500.0000 mg | Freq: Two times a day (BID) | ORAL | Status: DC
  Administered 2021-04-16 – 2021-04-17 (×2): 500 mg
  Filled 2021-04-16 (×2): qty 5

## 2021-04-16 MED ORDER — FENTANYL CITRATE (PF) 100 MCG/2ML IJ SOLN
25.0000 ug | INTRAMUSCULAR | Status: DC | PRN
  Administered 2021-04-16: 25 ug via INTRAVENOUS
  Administered 2021-04-16: 100 ug via INTRAVENOUS
  Administered 2021-04-16: 50 ug via INTRAVENOUS
  Filled 2021-04-16 (×3): qty 2

## 2021-04-16 MED ORDER — ACETAMINOPHEN 160 MG/5ML PO SOLN
650.0000 mg | ORAL | Status: DC | PRN
  Administered 2021-04-16: 650 mg
  Filled 2021-04-16: qty 20.3

## 2021-04-16 MED ORDER — ORAL CARE MOUTH RINSE
15.0000 mL | OROMUCOSAL | Status: DC
  Administered 2021-04-16 – 2021-04-17 (×15): 15 mL via OROMUCOSAL

## 2021-04-16 MED ORDER — LACTATED RINGERS IV SOLN: INTRAVENOUS | Status: DC

## 2021-04-16 MED ORDER — CHLORHEXIDINE GLUCONATE 0.12% ORAL RINSE (MEDLINE KIT)
15.0000 mL | Freq: Two times a day (BID) | OROMUCOSAL | Status: DC
  Administered 2021-04-16 – 2021-04-17 (×3): 15 mL via OROMUCOSAL

## 2021-04-16 MED ORDER — FENTANYL CITRATE (PF) 100 MCG/2ML IJ SOLN: 25.0000 ug | INTRAMUSCULAR | Status: DC | PRN

## 2021-04-16 MED ORDER — LEVETIRACETAM 100 MG/ML PO SOLN: 500.0000 mg | Freq: Two times a day (BID) | ORAL | Status: DC

## 2021-04-16 NOTE — Progress Notes (Signed)
eLink Physician-Brief Progress Note ?Patient Name: Luis Wolfe ?DOB: Mar 30, 1940 ?MRN: [id number] ? ? ?Date of Service ? 04/16/2021  ?HPI/Events of Note ? Patient with sub-optimal blood pressure control.  ?eICU Interventions ? PRN iv Hydralazine dose changed from 5 mg to 5-10 mg Q 4 hours PRN.  ? ? ? ?  ? ?[name] ?04/16/2021, 7:54 PM ?

## 2021-04-16 NOTE — Progress Notes (Signed)
Pharmacy Antibiotic Note ? ?Luis Wolfe is a 81 y.o. male admitted on 04/15/2021 with sepsis.  Pharmacy has been consulted for Vancomycin/Cefepime dosing. Presented to the ED with fever and respiratory failure requiring intubation. WBC is elevated. Noted renal dysfunction.  ? ?Plan: ?Vancomycin 1500 mg IV x 1, then 1250 mg IV q48h ?>>>Estimated AUC: 500 ?Cefepime 2g IV q24h ?Trend WBC, temp, renal function  ?Adjust anti-biotics if renal function improves ?F/U infectious work-up ?Drug levels as indicated ? ? ?Temp (24hrs), Avg:101.9 ?F (38.8 ?C), Min:101.2 ?F (38.4 ?C), Max:102.5 ?F (39.2 ?C) ? ?Recent Labs  ?Lab 04/15/21 ?2311 04/15/21 ?2337  ?WBC 19.1*  --   ?CREATININE 2.43* 2.40*  ?LATICACIDVEN 2.0*  --   ?  ?CrCl cannot be calculated (Unknown ideal weight.).   ? ?Allergies  ?Allergen Reactions  ? Ibuprofen Swelling  ? Tamsulosin Other (See Comments)  ?  hallucinations  ? Ibuprofen Swelling  ? ? ?[name], PharmD, BCPS ?Clinical Pharmacist ?Phone: [phone] ? ? ?

## 2021-04-16 NOTE — Progress Notes (Signed)
Wife called and update. Aware the goal to wean off ventilator. She plans to visit at some point today. Of note, she states she gives him prozac daily and his blood pressure medication was recently increased. No known history of atrial fibrillation. ?Consider in person continued [place] conversation.  ? ?Gerlene Fee, DO ?04/16/2021, 10:34 AM ?PGY-3, [place] ? ?

## 2021-04-16 NOTE — Progress Notes (Signed)
eLink Physician-Brief Progress Note ?Patient Name: Luis Wolfe ?DOB: 1940-05-01 ?MRN: [id number] ? ? ?Date of Service ? 04/16/2021  ?HPI/Events of Note ? Patient's wife confirmed that patient takes Keppra 500 mg PO bid at home, by reading off the medicine container label.  ?eICU Interventions ? Keppra 500 mg iv Q 12 hour ordered.  ? ? ? ?  ? ?[name] ?04/16/2021, 5:02 AM ?

## 2021-04-16 NOTE — Progress Notes (Signed)
Dr. Lynetta Mare was notified of patient's consistently high BP 150-180-90-100. PRN med ordered. Also notified of slight difference in pupil size but brisk reacton bilaterally. ?

## 2021-04-16 NOTE — Progress Notes (Signed)
eLink Physician-Brief Progress Note ?Patient Name: Luis Wolfe ?DOB: [birth year] ?MRN: [id number] ? ? ?Date of Service ? 04/16/2021  ?HPI/Events of Note ? Patient reportedly on seizure medications at home.  ?eICU Interventions ? Bedside RN instructed to call his wife and have her read out the names and dosages of all medications that she gives him at home (she is his principal caregiver) so that we can determine what medication (and dose) he takes for seizures, as well as if he takes any medications that would suggest a prior history of atrial fibrillation.    ? ? ? ?  ? ?[name] ?04/16/2021, 3:19 AM ?

## 2021-04-16 NOTE — Progress Notes (Signed)
Sepsis tracking by eLINK 

## 2021-04-16 NOTE — ED Notes (Signed)
Verbal order from Dr. Francia Greaves to placed temp sensing foley catheter. This RN attempted to insert foley catheter with the assistance of nurse tech but was unsuccessful, met resistance. Order from ICU MD to in and out cath patient.  ?

## 2021-04-16 NOTE — Progress Notes (Signed)
? ?NAME:  Luis Wolfe, MRN:  [id number], DOB:  [birth year], LOS: 0 ?ADMISSION DATE:  04/15/2021, CONSULTATION DATE:  04/16/2021 ?REFERRING MD: Kipp Brood, MD, CHIEF COMPLAINT:  respiratory failure  ? ?History of Present Illness:  ?Luis Wolfe is a 81 y.o. man with multiple medical issues including HTN, HLD, Type 2 DM, Alzheimer's dementia. Was found down at home by wife for unknown amount of time. History is limited obtained from wife who is at bedside.  Patient has had an unfortunate decline in his health and mental status over the past few years.  His wife notes that he has not been in his right mind for many many years.  He is 100% fully dependent on her for all ADLs.  On most days he does not know who she is.  Apparently family doctor had started talking about hospice as an option.  His wife notes that he was in his usual state of health yesterday, eating okay, not having any complaints or distress.  Earlier today he was found down on the floor for an unknown amount of time.  Apparently he likes to lay on the ground for fun and that is with the life that he was doing.  When he was not arousable she called EMS.  He was found to be hypoxic on their arrival and intermittently required bag-valve-mask ventilation.  Upon arrival to the ED he was unresponsive, hypoxic, and respiratory distress.  He was emergently intubated.  Received etomidate and succinylcholine for intubation. Otherwise not on any continuous medications.  He was found to be febrile on admission with leukocytosis and left shift.  PCCM was consulted to admit to ICU. ? ?Pertinent  Medical History  ?HTN ?HLD ?Type 2 DM ?Alzheimer's Dementia ? ?Significant Hospital Events: ?Including procedures, antibiotic start and stop dates in addition to other pertinent events   ?3/13 brought into ED with decreased responsiveness and Intubated for AMS and respiratory failure ?3/14 switch to CTX ? ?Interim History / Subjective:  ? ?Keppra started  overnight. ? ?Objective   ?Blood pressure (!) 157/90, pulse 91, temperature 99.6 ?F (37.6 ?C), temperature source Oral, resp. rate (!) 24, weight 72.7 kg, SpO2 100 %. ?   ?Vent Mode: PRVC ?FiO2 (%):  [50 %-100 %] 50 % ?Set Rate:  [18 bmp] 18 bmp ?Vt Set:  [530 mL-550 mL] 530 mL ?PEEP:  [5 cmH20] 5 cmH20 ?Plateau Pressure:  [16 cmH20-20 cmH20] 16 cmH20  ? ?Intake/Output Summary (Last 24 hours) at 04/16/2021 0721 ?Last data filed at 04/16/2021 0700 ?Gross per 24 hour  ?Intake 3621.09 ml  ?Output 300 ml  ?Net 3321.09 ml  ? ?Filed Weights  ? 04/16/21 0200  ?Weight: 72.7 kg  ? ? ?Examination: ?General: Appears ill, no acute distress. Age appropriate. ?Cardiac: Difficult to appreciate due to mechanical ventilation ?Respiratory: Mechanical vent sound auscultated  ?Abdomen: soft, nontender, nondistended ?Extremities: No LE edema or cyanosis. ?Skin: Warm and dry, multiple skin abrasions on arms bilaterally ?Neuro: Opens eyes to name, does not follow commands. Movement to stimuli ? ?Labs and imaging reviewed: ?UOP 170 mL ?NG 130 mL ?Cr 2.43>2.12 ?AST/ALT 354/228 ?UA- proteinuria, RBCs ?UCx pending ?WBC 12.3 ?Hgb 10.5 ?LA 2.0>2.3 ? ? ?Resolved Hospital Problem list   ? ? ?Assessment & Plan:  ?Luis Wolfe is a 81 y.o. man who presents with: ? ?Acute hypoxemic respiratory failure ?Intubated for respiratory failure/encephalopathy, possible PNA. FiO2 50%.  ?-continue vent as appropriate ? ?Severe Sepsis ?Leukocytosis improving ?Encephalopathy, will respond to name, not  to commands ?febrile to 102.5 on admission, afebrile overnight ?Possible PNA based on CXR ?Bld cx pending ?UA not convincing for UTIL;Ur cx pending ?Stop abx vanc/cefepime; narrow to CTX ? ?Acute metabolic encephalopathy ?Alzheimer's dementia ?Underlying dementia with acute illness related delirium ?Minimize sedating agents as able ?Wife notes a history of sundowning and strong reactions to pain and anxiety medications ?-Home medication according to wife: Prozac 40  mg daily, Bupropion 150 mg daily, Buspirone 15 mg BID ?-Restart buspirone if per tube ?-Prozac not recently filled; to be clarified with the wife ? ?CKD Stage IV ?Cr 2.43>2.12; Baseline 2.0-2.1 ?-LR 150 mL/h, monitor Cr and UOP ? ?Erythematous patchy rash ?Concerning for rug burns given clinical history of patient falling or laying on the ground and requiring assistance to get up ?Blanchable redness on sacrum, POA ? ?Hx of seizures? ?Started on Keppra 11/22 according to nursing who spoke with patient's wife ?-Keppra 500 mg BID restarted overnight ? ?Hx of HTN  Hx of Afib? (CHADSVASc score 3) ?Initially soft BPs upon arrival to ED. Now significant hypertensive and tachycardic. Afib on tele. ?Home medications given to nursing staff by wife: Lisinopril 10 mg daily, Metoprolol 50 mg BID ?-Consider restarting home metoprolol per tube if appropriate ?-Consider need of anti-coag ?-The above may not be necessary with GOC in mind ? ?Goals of care ?3/13- Dr. Shearon Stalls had a thorough conversation with his wife Dorian Pod at the bedside.  Given his clinical decline over the last several years and the fact that his primary care doctor had already started talking about hospice, she recommended a DNR to which she was agreeable.  They discussed a time-limited trial of life support to see if we can reverse any acute illness including sepsis related to pneumonia or UTI.  They have a son who lives near [place] who will come up as well.  We will make him DNR ? ?Best Practice (right click and "Reselect all SmartList Selections" daily)  ? ?Diet/type: NPO w/ meds via tube ?DVT prophylaxis: prophylactic heparin  ?GI prophylaxis: H2B ?Lines: N/A ?Foley:  Yes, and it is still needed ?Code Status:  DNR ?Last date of multidisciplinary goals of care discussion [Wife Dorian Pod updated at bedside. Verified that he would be DNR. Recommend time limited trial of life support to see if he can recover from this suspected acute infection] ? ?Labs    ?CBC: ?Recent Labs  ?Lab 04/15/21 ?2311 04/15/21 ?2312 04/15/21 ?2337 04/16/21 ?0323  ?WBC 19.1*  --   --  12.3*  ?NEUTROABS 16.8*  --   --   --   ?HGB 11.5* 11.6* 12.2*  12.2* 10.5*  ?HCT 36.4* 34.0* 36.0*  36.0* 34.3*  ?MCV 92.9  --   --  95.0  ?PLT 371  --   --  213  ? ? ? ?Basic Metabolic Panel: ?Recent Labs  ?Lab 04/15/21 ?2311 04/15/21 ?2312 04/15/21 ?2337 04/16/21 ?0323  ?NA 133* 134* 135  135 134*  ?K 5.0 4.9 5.0  5.0 4.2  ?CL 102  --  103 110  ?CO2 21*  --   --  13*  ?GLUCOSE 138*  --  138* 123*  ?BUN 36*  --  38* 38*  ?CREATININE 2.43*  --  2.40* 2.12*  ?CALCIUM 8.6*  --   --  7.9*  ?MG  --   --   --  2.1  ?PHOS  --   --   --  3.3  ? ? ?GFR: ?CrCl cannot be calculated (Unknown ideal weight.). ?  Recent Labs  ?Lab 04/15/21 ?2311 04/16/21 ?0323  ?WBC 19.1* 12.3*  ?LATICACIDVEN 2.0* 2.3*  ? ? ?Liver Function Tests: ?Recent Labs  ?Lab 04/15/21 ?2311  ?AST 354*  ?ALT 228*  ?ALKPHOS 58  ?BILITOT 0.9  ?PROT 6.4*  ?ALBUMIN 3.2*  ? ?No results for input(s): LIPASE, AMYLASE in the last 168 hours. ?Recent Labs  ?Lab 04/15/21 ?2311  ?AMMONIA 27  ? ? ?ABG ?   ?Component Value Date/Time  ? PHART 7.307 (L) 04/15/2021 2312  ? PCO2ART 42.2 04/15/2021 2312  ? PO2ART 237 (H) 04/15/2021 2312  ? HCO3 22.2 04/15/2021 2337  ? TCO2 24 04/15/2021 2337  ? TCO2 22 04/15/2021 2337  ? ACIDBASEDEF 4.0 (H) 04/15/2021 2337  ? O2SAT 99 04/15/2021 2337  ? ?  ? ?Coagulation Profile: ?Recent Labs  ?Lab 04/16/21 ?0323  ?INR 1.3*  ? ? ?Cardiac Enzymes: ?No results for input(s): CKTOTAL, CKMB, CKMBINDEX, TROPONINI in the last 168 hours. ? ?HbA1C: ?No results found for: HGBA1C ? ?CBG: ?Recent Labs  ?Lab 04/15/21 ?2304 04/16/21 ?0215 04/16/21 ?2130  ?GLUCAP 134* 116* 107*  ? ? ? ? ?Critical care time: 30 ?  ? ? ? ? ?

## 2021-04-16 NOTE — Progress Notes (Signed)
eLink Physician-Brief Progress Note ?Patient Name: Luis Wolfe ?DOB: 01-31-1941 ?MRN: [id number] ? ? ?Date of Service ? 04/16/2021  ?HPI/Events of Note ? Patient found altered and hypoxemic at home and transported to the ED where he was intubated for acute hypoxemic respiratory failure / airway protection, he has a fever up to 102 degrees, and a leukocytosis of 19 K, CXR show a retrocardiac consolidation r/o pneumonia.  ?eICU Interventions ? New Patient Evaluation.  ? ? ? ?  ? ?[name] ?04/16/2021, 2:24 AM ?

## 2021-04-16 NOTE — ED Notes (Signed)
Patient transported to CT and then to be transported to 2M05 ?

## 2021-04-16 NOTE — Progress Notes (Signed)
eLink Physician-Brief Progress Note ?Patient Name: Luis Wolfe ?DOB: May 01, 1940 ?MRN: [id number] ? ? ?Date of Service ? 04/16/2021  ?HPI/Events of Note ? Patient with fever, he also has modest elevation of his liver enzymes.  ?eICU Interventions ? Will order PRN Tylenol via NG tube, limited to 8 doses.  ? ? ? ?  ? ?[name] ?04/16/2021, 11:43 PM ?

## 2021-04-17 LAB — BASIC METABOLIC PANEL
Anion gap: 12 (ref 5–15)
BUN: 36 mg/dL — ABNORMAL HIGH (ref 8–23)
CO2: 17 mmol/L — ABNORMAL LOW (ref 22–32)
Calcium: 8.3 mg/dL — ABNORMAL LOW (ref 8.9–10.3)
Chloride: 105 mmol/L (ref 98–111)
Creatinine, Ser: 1.48 mg/dL — ABNORMAL HIGH (ref 0.61–1.24)
GFR, Estimated: 48 mL/min — ABNORMAL LOW (ref >60)
Glucose, Bld: 118 mg/dL — ABNORMAL HIGH (ref 70–99)
Potassium: 3.8 mmol/L (ref 3.5–5.1)
Sodium: 134 mmol/L — ABNORMAL LOW (ref 135–145)

## 2021-04-17 LAB — BLOOD CULTURE ID PANEL (REFLEXED) - BCID2

## 2021-04-17 LAB — URINE CULTURE: Culture: NO GROWTH

## 2021-04-17 LAB — CBC
HCT: 31.9 % — ABNORMAL LOW (ref 39.0–52.0)
Hemoglobin: 10.3 g/dL — ABNORMAL LOW (ref 13.0–17.0)
MCH: 29.4 pg (ref 26.0–34.0)
MCHC: 32.3 g/dL (ref 30.0–36.0)
MCV: 91.1 fL (ref 80.0–100.0)
Platelets: 175 10*3/uL (ref 150–400)
RBC: 3.5 MIL/uL — ABNORMAL LOW (ref 4.22–5.81)
RDW: 13.5 % (ref 11.5–15.5)
WBC: 11.8 10*3/uL — ABNORMAL HIGH (ref 4.0–10.5)
nRBC: 0 % (ref 0.0–0.2)

## 2021-04-17 LAB — GLUCOSE, CAPILLARY
Glucose-Capillary: 109 mg/dL — ABNORMAL HIGH (ref 70–99)
Glucose-Capillary: 111 mg/dL — ABNORMAL HIGH (ref 70–99)
Glucose-Capillary: 120 mg/dL — ABNORMAL HIGH (ref 70–99)
Glucose-Capillary: 109 mg/dL — ABNORMAL HIGH (ref 70–99)
Glucose-Capillary: 111 mg/dL — ABNORMAL HIGH (ref 70–99)

## 2021-04-17 LAB — MAGNESIUM: Magnesium: 2 mg/dL (ref 1.7–2.4)

## 2021-04-17 MED ORDER — FUROSEMIDE 10 MG/ML IJ SOLN
40.0000 mg | Freq: Once | INTRAMUSCULAR | Status: AC
  Administered 2021-04-17: 40 mg via INTRAVENOUS
  Filled 2021-04-17: qty 4

## 2021-04-17 MED ORDER — HYDRALAZINE HCL 20 MG/ML IJ SOLN
5.0000 mg | INTRAMUSCULAR | Status: DC | PRN
  Administered 2021-04-17 – 2021-04-18 (×3): 10 mg via INTRAVENOUS
  Filled 2021-04-17 (×3): qty 1

## 2021-04-17 MED ORDER — POLYETHYLENE GLYCOL 3350 17 G PO PACK: 17.0000 g | PACK | Freq: Every day | ORAL | Status: DC | PRN

## 2021-04-17 MED ORDER — FAMOTIDINE 20 MG PO TABS
20.0000 mg | ORAL_TABLET | Freq: Every day | ORAL | Status: DC
  Administered 2021-04-19 – 2021-04-20 (×2): 20 mg via ORAL
  Filled 2021-04-17 (×3): qty 1

## 2021-04-17 MED ORDER — DOCUSATE SODIUM 100 MG PO CAPS: 100.0000 mg | ORAL_CAPSULE | Freq: Two times a day (BID) | ORAL | Status: DC | PRN

## 2021-04-17 MED ORDER — BUSPIRONE HCL 15 MG PO TABS
15.0000 mg | ORAL_TABLET | Freq: Two times a day (BID) | ORAL | Status: DC
  Administered 2021-04-17 – 2021-04-20 (×6): 15 mg via ORAL
  Filled 2021-04-17 (×7): qty 1

## 2021-04-17 MED ORDER — FLUOXETINE HCL 20 MG PO CAPS
40.0000 mg | ORAL_CAPSULE | Freq: Every day | ORAL | Status: DC
Start: 2021-04-18 — End: 2021-04-21
  Administered 2021-04-19 – 2021-04-20 (×2): 40 mg via ORAL
  Filled 2021-04-17: qty 2
  Filled 2021-04-17: qty 4
  Filled 2021-04-17: qty 2

## 2021-04-17 MED ORDER — LEVETIRACETAM 500 MG PO TABS
500.0000 mg | ORAL_TABLET | Freq: Two times a day (BID) | ORAL | Status: DC
  Administered 2021-04-17: 500 mg via ORAL
  Filled 2021-04-17 (×2): qty 1

## 2021-04-17 MED ORDER — ACETAMINOPHEN 325 MG PO TABS: 650.0000 mg | ORAL_TABLET | Freq: Four times a day (QID) | ORAL | Status: DC | PRN

## 2021-04-17 MED ORDER — METOPROLOL TARTRATE 25 MG PO TABS
25.0000 mg | ORAL_TABLET | Freq: Two times a day (BID) | ORAL | Status: DC
Start: 2021-04-17 — End: 2021-04-21
  Administered 2021-04-17 – 2021-04-20 (×6): 25 mg via ORAL
  Filled 2021-04-17 (×7): qty 1

## 2021-04-17 MED ORDER — ORAL CARE MOUTH RINSE
15.0000 mL | Freq: Two times a day (BID) | OROMUCOSAL | Status: DC
  Administered 2021-04-17 – 2021-04-18 (×2): 15 mL via OROMUCOSAL

## 2021-04-17 NOTE — Procedures (Signed)
Extubation Procedure Note ? ?Patient Details:   ?Name: Luis Wolfe ?DOB: August 20, 1940 ?MRN: [id number] ?  ?Airway Documentation:  ?  ?Vent end date: (not recorded) Vent end time: (not recorded)  ? ?Evaluation ? O2 sats: stable throughout ?Complications: No apparent complications ?Patient did tolerate procedure well. ?Bilateral Breath Sounds: Clear, Diminished ?  ?Yes ? ?Esperanza Sheets T ?04/17/2021, 10:46 AM ? ?

## 2021-04-17 NOTE — Progress Notes (Signed)
PHARMACY - PHYSICIAN COMMUNICATION ?CRITICAL VALUE ALERT - BLOOD CULTURE IDENTIFICATION (BCID) ? ?Luis Wolfe is an 81 y.o. male who presented to [facility] on 04/15/2021 with a chief complaint of "found down", r/o sepsis.  ? ?Assessment:  WBC trending down, last temp 99.7 ? ?Name of physician (or Provider) Contacted: Dr. Lucile Shutters ? ?Current antibiotics: Ceftriaxone  ? ?Changes to prescribed antibiotics recommended:  ?No changes ? ?Results for orders placed or performed during the hospital encounter of 04/15/21  ?Blood Culture ID Panel (Reflexed) (Collected: 04/15/2021 11:10 PM)  ?Result Value Ref Range  ? Enterococcus faecalis NOT DETECTED NOT DETECTED  ? Enterococcus Faecium NOT DETECTED NOT DETECTED  ? Listeria monocytogenes NOT DETECTED NOT DETECTED  ? Staphylococcus species DETECTED (A) NOT DETECTED  ? Staphylococcus aureus (BCID) NOT DETECTED NOT DETECTED  ? Staphylococcus epidermidis NOT DETECTED NOT DETECTED  ? Staphylococcus lugdunensis NOT DETECTED NOT DETECTED  ? Streptococcus species NOT DETECTED NOT DETECTED  ? Streptococcus agalactiae NOT DETECTED NOT DETECTED  ? Streptococcus pneumoniae NOT DETECTED NOT DETECTED  ? Streptococcus pyogenes NOT DETECTED NOT DETECTED  ? A.calcoaceticus-baumannii NOT DETECTED NOT DETECTED  ? Bacteroides fragilis NOT DETECTED NOT DETECTED  ? Enterobacterales NOT DETECTED NOT DETECTED  ? Enterobacter cloacae complex NOT DETECTED NOT DETECTED  ? Escherichia coli NOT DETECTED NOT DETECTED  ? Klebsiella aerogenes NOT DETECTED NOT DETECTED  ? Klebsiella oxytoca NOT DETECTED NOT DETECTED  ? Klebsiella pneumoniae NOT DETECTED NOT DETECTED  ? Proteus species NOT DETECTED NOT DETECTED  ? Salmonella species NOT DETECTED NOT DETECTED  ? Serratia marcescens NOT DETECTED NOT DETECTED  ? Haemophilus influenzae NOT DETECTED NOT DETECTED  ? Neisseria meningitidis NOT DETECTED NOT DETECTED  ? Pseudomonas aeruginosa NOT DETECTED NOT DETECTED  ? Stenotrophomonas maltophilia NOT DETECTED NOT  DETECTED  ? Candida albicans NOT DETECTED NOT DETECTED  ? Candida auris NOT DETECTED NOT DETECTED  ? Candida glabrata NOT DETECTED NOT DETECTED  ? Candida krusei NOT DETECTED NOT DETECTED  ? Candida parapsilosis NOT DETECTED NOT DETECTED  ? Candida tropicalis NOT DETECTED NOT DETECTED  ? Cryptococcus neoformans/gattii NOT DETECTED NOT DETECTED  ? ? ?[name] ?04/17/2021  1:21 AM ? ?

## 2021-04-17 NOTE — Progress Notes (Addendum)
? ?NAME:  CHANDAN FLY, MRN:  [id number], DOB:  September 10, 1940, LOS: 1 ?ADMISSION DATE:  04/15/2021, CONSULTATION DATE:  04/17/2021 ?REFERRING MD: Kipp Brood, MD, CHIEF COMPLAINT:  respiratory failure  ? ?History of Present Illness:  ?RAHM MINIX is a 81 y.o. man with multiple medical issues including HTN, HLD, Type 2 DM, Alzheimer's dementia. Was found down at home by wife for unknown amount of time. History is limited obtained from wife who is at bedside.  Patient has had an unfortunate decline in his health and mental status over the past few years.  His wife notes that he has not been in his right mind for many many years.  He is 100% fully dependent on her for all ADLs.  On most days he does not know who she is.  Apparently family doctor had started talking about hospice as an option.  His wife notes that he was in his usual state of health yesterday, eating okay, not having any complaints or distress.  Earlier today he was found down on the floor for an unknown amount of time.  Apparently he likes to lay on the ground for fun and that is with the life that he was doing.  When he was not arousable she called EMS.  He was found to be hypoxic on their arrival and intermittently required bag-valve-mask ventilation.  Upon arrival to the ED he was unresponsive, hypoxic, and respiratory distress.  He was emergently intubated.  Received etomidate and succinylcholine for intubation. Otherwise not on any continuous medications.  He was found to be febrile on admission with leukocytosis and left shift.  PCCM was consulted to admit to ICU. ? ?Pertinent  Medical History  ?HTN ?HLD ?Type 2 DM ?Alzheimer's Dementia ? ?Significant Hospital Events: ?Including procedures, antibiotic start and stop dates in addition to other pertinent events   ?3/13 brought into ED with decreased responsiveness and Intubated for AMS and respiratory failure ?3/14 switch to CTX ?3/15 extubated ? ?Interim History / Subjective:  ? ?Febrile 101.8.  Tylenol x1 ? ?Objective   ?Blood pressure (!) 158/80, pulse 76, temperature 97.9 ?F (36.6 ?C), temperature source Oral, resp. rate 18, height '5\' 7"'$  (1.702 m), weight 72.7 kg, SpO2 100 %. ?   ?Vent Mode: PRVC ?FiO2 (%):  [40 %-50 %] 40 % ?Set Rate:  [18 bmp] 18 bmp ?Vt Set:  [530 mL] 530 mL ?PEEP:  [5 cmH20] 5 cmH20 ?Plateau Pressure:  [13 cmH20-18 cmH20] 16 cmH20  ? ?Intake/Output Summary (Last 24 hours) at 04/17/2021 0723 ?Last data filed at 04/17/2021 0700 ?Gross per 24 hour  ?Intake 2296.73 ml  ?Output 785 ml  ?Net 1511.73 ml  ? ? ?Filed Weights  ? 04/16/21 0200 04/16/21 0745  ?Weight: 72.7 kg 72.7 kg  ? ? ?Examination: ?General: Appears chronically ill; intubated ?Cardiac: Irregular rate and rhthym ?Respiratory: normal effort, mechanical vent ?Abdomen: soft, nontender, nondistended ?Extremities: No LE edema or cyanosis. ?Neuro: alert to name. Follows commands intermittently ? ?Labs and imaging reviewed: ?UOP 710 mL ?NG 225 mL ?Cr 2.43>2.12>1.48 ?UCx pending ?WBC 12.3>11.8 ? ?Resolved Hospital Problem list   ? ? ?Assessment & Plan:  ?KYNG MATLOCK is a 81 y.o. man who presents with: ? ?Acute hypoxemic respiratory failure ?Intubated for respiratory failure/encephalopathy, possible PNA. FiO2 40%.  ?-continue vent as appropriate; consider extubation today ? ?Severe Sepsis ?Leukocytosis improving ?Encephalopathy, will respond to name, not to commands ?Febrile overnight 101.8 ?Possible PNA based on CXR ?Bld cx with [facility]; staph species suspect contaminate ?-  Follow up urine cx ?- Consider need for vanc if not considering contaminate ? ?Acute metabolic encephalopathy ?Alzheimer's dementia ?Underlying dementia with acute illness related delirium ?Minimize sedating agents as able ?Home medications: Prozac 40 mg daily, Bupropion 150 mg daily, Buspirone 15 mg BID ?-Continue buspirone if per tube ?-Restart Prozac today ?-Hold buproprion; can lower seizure threshold ? ?CKD Stage IV ?- Monitor Cr and UOP ?- LR 50  mL/h ? ?T2DM ?Diet controlled. CBG most recently 111.  ?-Consider D5 if remains NPO ?-Consider starting tube feeds ? ?Erythematous patchy rash ?Concerning for rug burns given clinical history of patient falling or laying on the ground and requiring assistance to get up ?Blanchable redness on sacrum, POA ? ?Hx of seizures ?Without seizure during admission.  ?-Continue Keppra 500 mg BID  ? ?Hx of HTN  Hx of Afib? (CHADSVASc score 3) ?Systolic hypertension. Afib on tele. ?Home medications: Lisinopril 10 mg daily, Metoprolol 50 mg BID ?-Continue metoprolol 25 mg BID per tube ?-Consider need of anti-coag ?-The above may not be necessary with GOC in mind ? ?Goals of care ?3/13- Dr. Shearon Stalls had a thorough conversation with his wife Dorian Pod at the bedside.  Given his clinical decline over the last several years and the fact that his primary care doctor had already started talking about hospice, she recommended a DNR to which she was agreeable.  They discussed a time-limited trial of life support to see if we can reverse any acute illness including sepsis related to pneumonia or UTI.  They have a son who lives near [place] who will come up as well.  We will make him DNR. ?3/14- Family (wife and son) update. Discussed we believe this is a big event and the likelihood of further decline while intubated is high.  ? ?Best Practice (right click and "Reselect all SmartList Selections" daily)  ? ?Diet/type: NPO w/ meds via tube ?DVT prophylaxis: prophylactic heparin  ?GI prophylaxis: H2B ?Lines: N/A ?Foley:  Yes, and it is still needed ?Code Status:  DNR ?Last date of multidisciplinary goals of care discussion [Wife Dorian Pod updated at bedside. Verified that he would be DNR. Recommend time limited trial of life support to see if he can recover from this suspected acute infection] ? ?Labs   ?CBC: ?Recent Labs  ?Lab 04/15/21 ?2311 04/15/21 ?2312 04/15/21 ?2337 04/16/21 ?0323 04/17/21 ?6834  ?WBC 19.1*  --   --  12.3* 11.8*  ?NEUTROABS  16.8*  --   --   --   --   ?HGB 11.5* 11.6* 12.2*  12.2* 10.5* 10.3*  ?HCT 36.4* 34.0* 36.0*  36.0* 34.3* 31.9*  ?MCV 92.9  --   --  95.0 91.1  ?PLT 371  --   --  213 175  ? ? ? ?Basic Metabolic Panel: ?Recent Labs  ?Lab 04/15/21 ?2311 04/15/21 ?2312 04/15/21 ?2337 04/16/21 ?0323 04/17/21 ?1962  ?NA 133* 134* 135  135 134* 134*  ?K 5.0 4.9 5.0  5.0 4.2 3.8  ?CL 102  --  103 110 105  ?CO2 21*  --   --  13* 17*  ?GLUCOSE 138*  --  138* 123* 118*  ?BUN 36*  --  38* 38* 36*  ?CREATININE 2.43*  --  2.40* 2.12* 1.48*  ?CALCIUM 8.6*  --   --  7.9* 8.3*  ?MG  --   --   --  2.1 2.0  ?PHOS  --   --   --  3.3  --   ? ? ?GFR: ?Estimated  Creatinine Clearance: 37.8 mL/min (A) (by C-G formula based on SCr of 1.48 mg/dL (H)). ?Recent Labs  ?Lab 04/15/21 ?2311 04/16/21 ?0323 04/17/21 ?3748  ?WBC 19.1* 12.3* 11.8*  ?LATICACIDVEN 2.0* 2.3*  --   ? ? ? ?Liver Function Tests: ?Recent Labs  ?Lab 04/15/21 ?2311  ?AST 354*  ?ALT 228*  ?ALKPHOS 58  ?BILITOT 0.9  ?PROT 6.4*  ?ALBUMIN 3.2*  ? ? ?No results for input(s): LIPASE, AMYLASE in the last 168 hours. ?Recent Labs  ?Lab 04/15/21 ?2311  ?AMMONIA 27  ? ? ? ?ABG ?   ?Component Value Date/Time  ? PHART 7.307 (L) 04/15/2021 2312  ? PCO2ART 42.2 04/15/2021 2312  ? PO2ART 237 (H) 04/15/2021 2312  ? HCO3 22.2 04/15/2021 2337  ? TCO2 24 04/15/2021 2337  ? TCO2 22 04/15/2021 2337  ? ACIDBASEDEF 4.0 (H) 04/15/2021 2337  ? O2SAT 99 04/15/2021 2337  ? ?  ? ?Coagulation Profile: ?Recent Labs  ?Lab 04/16/21 ?0323  ?INR 1.3*  ? ? ? ?Cardiac Enzymes: ?No results for input(s): CKTOTAL, CKMB, CKMBINDEX, TROPONINI in the last 168 hours. ? ?HbA1C: ?No results found for: HGBA1C ? ?CBG: ?Recent Labs  ?Lab 04/16/21 ?1104 04/16/21 ?1539 04/16/21 ?1927 04/16/21 ?2325 04/17/21 ?0314  ?GLUCAP 113* 116* 114* 92 109*  ? ? ? ? ?Critical care time: 30 ?  ? ?Gerlene Fee, DO ?04/17/2021, 7:23 AM ?PGY-3, [place] ? ? ? ?

## 2021-04-18 DIAGNOSIS — Z515 Encounter for palliative care: Secondary | ICD-10-CM

## 2021-04-18 LAB — CULTURE, BLOOD (ROUTINE X 2): Special Requests: ADEQUATE

## 2021-04-18 LAB — GLUCOSE, CAPILLARY
Glucose-Capillary: 102 mg/dL — ABNORMAL HIGH (ref 70–99)
Glucose-Capillary: 86 mg/dL (ref 70–99)
Glucose-Capillary: 86 mg/dL (ref 70–99)
Glucose-Capillary: 87 mg/dL (ref 70–99)

## 2021-04-18 LAB — BASIC METABOLIC PANEL
Anion gap: 11 (ref 5–15)
BUN: 33 mg/dL — ABNORMAL HIGH (ref 8–23)
CO2: 20 mmol/L — ABNORMAL LOW (ref 22–32)
Calcium: 8.5 mg/dL — ABNORMAL LOW (ref 8.9–10.3)
Chloride: 104 mmol/L (ref 98–111)
Creatinine, Ser: 1.4 mg/dL — ABNORMAL HIGH (ref 0.61–1.24)
GFR, Estimated: 51 mL/min — ABNORMAL LOW (ref >60)
Glucose, Bld: 107 mg/dL — ABNORMAL HIGH (ref 70–99)
Potassium: 3 mmol/L — ABNORMAL LOW (ref 3.5–5.1)
Sodium: 135 mmol/L (ref 135–145)

## 2021-04-18 LAB — CBC
HCT: 31.9 % — ABNORMAL LOW (ref 39.0–52.0)
Hemoglobin: 10.5 g/dL — ABNORMAL LOW (ref 13.0–17.0)
MCH: 29.4 pg (ref 26.0–34.0)
MCHC: 32.9 g/dL (ref 30.0–36.0)
MCV: 89.4 fL (ref 80.0–100.0)
Platelets: 206 10*3/uL (ref 150–400)
RBC: 3.57 MIL/uL — ABNORMAL LOW (ref 4.22–5.81)
RDW: 13.4 % (ref 11.5–15.5)
WBC: 13.8 10*3/uL — ABNORMAL HIGH (ref 4.0–10.5)
nRBC: 0 % (ref 0.0–0.2)

## 2021-04-18 LAB — MAGNESIUM: Magnesium: 2 mg/dL (ref 1.7–2.4)

## 2021-04-18 MED ORDER — POLYVINYL ALCOHOL 1.4 % OP SOLN
1.0000 [drp] | Freq: Every day | OPHTHALMIC | Status: DC | PRN
  Filled 2021-04-18: qty 15

## 2021-04-18 MED ORDER — LEVETIRACETAM IN NACL 500 MG/100ML IV SOLN
500.0000 mg | Freq: Two times a day (BID) | INTRAVENOUS | Status: DC
  Administered 2021-04-18: 500 mg via INTRAVENOUS
  Filled 2021-04-18 (×2): qty 100

## 2021-04-18 MED ORDER — POTASSIUM CHLORIDE 10 MEQ/100ML IV SOLN
10.0000 meq | INTRAVENOUS | Status: AC
  Administered 2021-04-18 (×4): 10 meq via INTRAVENOUS
  Filled 2021-04-18 (×4): qty 100

## 2021-04-18 MED ORDER — DORZOLAMIDE HCL 2 % OP SOLN
1.0000 [drp] | Freq: Two times a day (BID) | OPHTHALMIC | Status: DC
  Administered 2021-04-18 – 2021-04-20 (×6): 1 [drp] via OPHTHALMIC
  Filled 2021-04-18: qty 10

## 2021-04-18 MED ORDER — POTASSIUM CHLORIDE CRYS ER 20 MEQ PO TBCR
20.0000 meq | EXTENDED_RELEASE_TABLET | ORAL | Status: AC
  Administered 2021-04-18 (×2): 20 meq via ORAL
  Filled 2021-04-18 (×2): qty 1

## 2021-04-18 MED ORDER — LEVETIRACETAM 500 MG PO TABS
500.0000 mg | ORAL_TABLET | Freq: Two times a day (BID) | ORAL | Status: DC
  Administered 2021-04-18 – 2021-04-20 (×5): 500 mg via ORAL
  Filled 2021-04-18 (×5): qty 1

## 2021-04-18 NOTE — Progress Notes (Signed)
? ? ? ?  Referral received for Luis Wolfe for goals of care discussion. Chart reviewed and updates received from RN. Patient assessed and is unable to engage appropriately in discussions.  ? ?Attempted to contact patient's wife Dyann Ruddle. Unable to reach. Voicemail left with contact information given.  Apparently the patient's son has also been visiting but there is no contact information in the chart for him. ? ?PMT will re-attempt to contact family at a later time/date. Detailed note and recommendations to follow once GOC has been completed.  ? ?Thank you for your referral and allowing PMT to assist in [place] care.  ? ?Walden Field, NP ?Palliative Medicine Team ?Phone: [phone] ? ?NO CHARGE  ? ?

## 2021-04-18 NOTE — Progress Notes (Signed)
[facility] ADULT ICU REPLACEMENT PROTOCOL ? ? ?The patient does apply for the [facility] Adult ICU Electrolyte Replacment Protocol based on the criteria listed below:  ? ?1.Exclusion criteria: TCTS patients, ECMO patients, and Dialysis patients ?2. Is GFR >/= 30 ml/min? Yes.    ?Patient's GFR today is 51 ?3. Is SCr </= 2? Yes.   ?Patient's SCr is 1.4 mg/dL ?4. Did SCr increase >/= 0.5 in 24 hours? No. ?5.Pt's weight >40kg  Yes.   ?6. Abnormal electrolyte(s): K 3.0  ?7. Electrolytes replaced per protocol ?8.  Call MD STAT for K+ </= 2.5, Phos </= 1, or Mag </= 1 ?Physician:   ? ?[facility], Nazia Rhines A 04/18/2021 2:47 AM ? ?
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
## 2021-04-18 NOTE — Progress Notes (Addendum)
? ? ? ? ?NAME:  LADEN FIELDHOUSE, MRN:  [id number], DOB:  [birth year], LOS: 2 ?ADMISSION DATE:  04/15/2021, CONSULTATION DATE:  04/18/2021 ?REFERRING MD: Kipp Brood, MD, CHIEF COMPLAINT:  respiratory failure  ? ?History of Present Illness:  ?Luis Wolfe is a 81 y.o. man with multiple medical issues including HTN, HLD, Type 2 DM, Alzheimer's dementia. Was found down at home by wife for unknown amount of time. History is limited obtained from wife who is at bedside.  Patient has had an unfortunate decline in his health and mental status over the past few years.  His wife notes that he has not been in his right mind for many many years.  He is 100% fully dependent on her for all ADLs.  On most days he does not know who she is.  Apparently family doctor had started talking about hospice as an option.  His wife notes that he was in his usual state of health yesterday, eating okay, not having any complaints or distress.  Earlier today he was found down on the floor for an unknown amount of time.  Apparently he likes to lay on the ground for fun and that is with the life that he was doing.  When he was not arousable she called EMS.  He was found to be hypoxic on their arrival and intermittently required bag-valve-mask ventilation.  Upon arrival to the ED he was unresponsive, hypoxic, and respiratory distress.  He was emergently intubated.  Received etomidate and succinylcholine for intubation. Otherwise not on any continuous medications.  He was found to be febrile on admission with leukocytosis and left shift.  PCCM was consulted to admit to ICU. ? ?Pertinent  Medical History  ?HTN ?HLD ?Type 2 DM ?Alzheimer's Dementia ? ?Significant Hospital Events: ?Including procedures, antibiotic start and stop dates in addition to other pertinent events   ?3/13 brought into ED with decreased responsiveness and Intubated for AMS and respiratory failure ?3/14 switch to CTX ?3/15 extubated ? ?Interim History / Subjective:  ?Tmax  98.9 ? ?-1.2 L past 24 hours, multiple bowel movements, +3 L this admission ? ?No subjective complaints from patient, patient confused ? ?Objective   ?Blood pressure (!) 167/80, pulse 75, temperature 98.9 ?F (37.2 ?C), temperature source Oral, resp. rate (!) 22, height '5\' 7"'$  (1.702 m), weight 74.6 kg, SpO2 98 %. ?   ? ?Intake/Output Summary (Last 24 hours) at 04/18/2021 0730 ?Last data filed at 04/18/2021 0700 ?Gross per 24 hour  ?Intake 858.09 ml  ?Output 2055 ml  ?Net -1196.91 ml  ? ?Filed Weights  ? 04/16/21 0200 04/16/21 0745 04/18/21 0500  ?Weight: 72.7 kg 72.7 kg 74.6 kg  ? ? ?Examination: ?General: In bed, NAD, appears comfortable ?HEENT: MM pink/moist, anicteric, atraumatic ?Neuro: RASS 0, PERRL 29m, GCS 14, FC, confused, MAE ?CV: S1S2, Afib, no m/r/g appreciated ?PULM:  clear in the upper lobes, clear in the lower lobes, trachea midline, chest expansion symmetric ?GI: soft, bsx4 active, non-tender   ?Extremities: warm/dry, no pretibial edema, capillary refill less than 3 seconds  ?Skin:  no rashes or lesions noted ? ?Pertinent labs: ?Blood cultures: No growth 2 days, 1 bottle Staph capitis ?Urine culture: No growth final ?Blood glucose: 87-120 ?WBC: 11.8> 13.8 ?Hemoglobin: 10.3> 10.5 ?Potassium: 3.0 ?Creatinine: 2.12> 1.48> 1.40 ? ?Resolved Hospital Problem list   ? ? ?Assessment & Plan:  ? ?Acute hypoxemic respiratory failure-resolved ?Intubated for respiratory failure/encephalopathy, possible PNA. Now on room air. ?-Pulmonary toilet as able ? ?Severe Sepsis-improving ?  WBC: 11.8> 13.8, remains normotensive, room air, A-fib with rate control, 1 of 2 bottles growing staph.  Possibly secondary to community-acquired pneumonia. ?-Continue to follow cultures ?-Continue ceftriaxone through 3/18 ?-Follow fever WBC curve ?-Recheck a.m. CBC ? ?Acute metabolic encephalopathy-resolved ?Alzheimer's dementia ?Home medications: Prozac 40 mg daily, Bupropion 150 mg daily, Buspirone 15 mg BID ?-Continue Prozac and  buspirone ?-Continue holding Wellbutrin due to risk of seizure with new start of October 2022 and possible seizure in Dec 2022 ? ?CKD Stage IV ?Cr 2.43>2.12>1.48, 1.2 L of urine output ?-Hydration with p.o. diet, monitor intake ? ?Hypokalemia ?-Goal K above 4, goal MG above 2 ?-Repleted, follow-up on a.m. labs ? ?T2DM ?BG within goal ?-Blood Glucose goal 140-180. ?-SSI ? ?Hx of seizures ?-Continue Keppra ? ?Hx of HTN  Hx of Afib? (CHADSVASc score 3) ?Systolic hypertension. Afib on tele. ?Home medications: Lisinopril 10 mg daily, Metoprolol 50 mg BID ?-We will discuss anticoagulation with Dr. Lynetta Mare in setting of current [place] ?-Continue metoprolol 25 twice daily p.o. ? ?GOC ?Patient refusing meds and poor p.o. intake per nursing staff.  We will continue to discuss goals of care with family in the setting of increasing hospitalizations and history of dementia. ?-Palliative care consulted ?-Continue goals of care conversations ? ?Best Practice (right click and "Reselect all SmartList Selections" daily)  ? ?Diet/type: Regular consistency (see orders) ?DVT prophylaxis: prophylactic heparin  ?GI prophylaxis: N/A ?Lines: N/A ?Foley:  N/A ?Code Status:  DNR ?Last date of multidisciplinary goals of care discussion [ 3/13 Wife Dorian Pod updated at bedside. Verified that he would be DNR. Recommend time limited trial of life support to see if he can recover from this suspected acute infection] ? ?Critical care time: n/a ?  ? ?[employer]., MSN, APRN, AGACNP-BC ?[facility]  ?04/18/2021 , 7:30 AM ? ?Please see [email or web address] for pager details ? ?If no response, please call [phone] ?After hours, please call Elink at [phone] ? ? ? ? ? ?

## 2021-04-19 DIAGNOSIS — R410 Disorientation, unspecified: Secondary | ICD-10-CM

## 2021-04-19 DIAGNOSIS — Z66 Do not resuscitate: Secondary | ICD-10-CM

## 2021-04-19 DIAGNOSIS — R638 Other symptoms and signs concerning food and fluid intake: Secondary | ICD-10-CM

## 2021-04-19 DIAGNOSIS — F02C0 Dementia in other diseases classified elsewhere, severe, without behavioral disturbance, psychotic disturbance, mood disturbance, and anxiety: Secondary | ICD-10-CM

## 2021-04-19 LAB — CBC
HCT: 30.4 % — ABNORMAL LOW (ref 39.0–52.0)
Hemoglobin: 9.7 g/dL — ABNORMAL LOW (ref 13.0–17.0)
MCH: 29 pg (ref 26.0–34.0)
MCHC: 31.9 g/dL (ref 30.0–36.0)
MCV: 90.7 fL (ref 80.0–100.0)
Platelets: 202 10*3/uL (ref 150–400)
RBC: 3.35 MIL/uL — ABNORMAL LOW (ref 4.22–5.81)
RDW: 13.5 % (ref 11.5–15.5)
WBC: 11.3 10*3/uL — ABNORMAL HIGH (ref 4.0–10.5)
nRBC: 0 % (ref 0.0–0.2)

## 2021-04-19 LAB — BASIC METABOLIC PANEL
Anion gap: 8 (ref 5–15)
BUN: 27 mg/dL — ABNORMAL HIGH (ref 8–23)
CO2: 21 mmol/L — ABNORMAL LOW (ref 22–32)
Calcium: 8.4 mg/dL — ABNORMAL LOW (ref 8.9–10.3)
Chloride: 106 mmol/L (ref 98–111)
Creatinine, Ser: 1.23 mg/dL (ref 0.61–1.24)
GFR, Estimated: 60 mL/min — ABNORMAL LOW (ref >60)
Glucose, Bld: 123 mg/dL — ABNORMAL HIGH (ref 70–99)
Potassium: 3.7 mmol/L (ref 3.5–5.1)
Sodium: 135 mmol/L (ref 135–145)

## 2021-04-19 LAB — MAGNESIUM: Magnesium: 1.9 mg/dL (ref 1.7–2.4)

## 2021-04-19 LAB — PHOSPHORUS: Phosphorus: 1.2 mg/dL — ABNORMAL LOW (ref 2.5–4.6)

## 2021-04-19 NOTE — Care Management Important Message (Signed)
Important Message ? ?Patient Details  ?Name: DOMINGO FUSON ?MRN: [id number] ?Date of Birth: 06/14/1940 ? ? ?Medicare Important Message Given:  Yes ? ? ? ? ?Levada Dy  K[place]a Frances-Martin ?04/19/2021, 1:02 PM ?

## 2021-04-19 NOTE — Progress Notes (Signed)
?PROGRESS NOTE ? ?Luis Wolfe  ?DOB: [birth year]  ?PCP: Caryl Bis, MD ?HYI:[id number]  ?DOA: 04/15/2021 ? LOS: 3 days  ?Hospital Day: 5 ? ?History of Present Illness / Brief narrative:  ?Luis Wolfe is a 81 y.o. man with multiple medical issues including HTN, HLD, Type 2 DM, Alzheimer's dementia who was living at home with his wife.  [place] to his wife, he has been progressively declining in his physical and mental health over the last few years to a point where he is now total care.  Family was in process of starting hospice care for him through his PCP. ?On 3/13, patient was found down on the floor for unknown duration.  EMS found hypoxic and brought to the ED on bag mask ventilation ?In the ED, patient was unresponsive hypoxic, 80 intubated and admitted to ICU ?He was also found to be febrile on admission with leukocytosis. ?He was managed for pneumonia, successfully extubated ?Transferred out to hospitalist service on 3/17. ?Palliative.. ? ?Subjective:  ?Seen and examined this morning and again this afternoon with family.  Alert, awake, unable to answer questions.  Gurgling on the throat noted ? ?Hospital Course:  ?Comfort care status ?-Primarily admitted after he was found unresponsive on the floor. ?-Needed intubation.  Subsequently extubated. ?-Palliative care consultation was called.  We had a family meeting today.  Family was already in process of getting hospice care for him prior to this hospitalization.  They understand his declining health status and agreed to hospice care.  Residential hospice placement plan. ?-Currently comfort measures in place. ? ?Other issues addressed during this hospitalization were: ?Acute hypoxemic respiratory failure-resolved ?Severe Sepsis ?Acute metabolic encephalopathy-resolved ?Alzheimer's dementia ?CKD Stage IV ?Hypokalemia ?T2DM ?Hx of seizures ?Hx of HTN  Hx of Afib? (CHADSVASc score 3) ? ?Goals of care ?  Code Status: DNR  ? ?Nutritional status:  ?Body mass  index is 25.38 kg/m?.  ?  ?  ? ? ? ? ?Diet:  ?Diet Order   ? ?       ?  Diet heart healthy/carb modified Room service appropriate? Yes; Fluid consistency: Thin  Diet effective now       ?  ? ?  ?  ? ?  ? ? ?DVT prophylaxis:  ?heparin injection 5,000 Units Start: 04/16/21 1400 ?SCDs Start: 04/16/21 0016 ?  ?Antimicrobials: None ?Fluid: None ?Consultants: Palliative care ?Family Communication: Discussed with wife and son at bedside today ? ?Status is: Inpatient ? ?Continue in-hospital care because: Comfort care measures.  Pending arrangements for home hospice ?Level of care: Med-Surg  ? ?Dispo: The patient is from: Home ?             Anticipated d/c is to: Hospice at home ?           ?Infusions:  ? sodium chloride Stopped (04/18/21 1434)  ? cefTRIAXone (ROCEPHIN)  IV Stopped (04/18/21 2300)  ? ? ?Scheduled Meds: ? busPIRone  15 mg Oral BID  ? Chlorhexidine Gluconate Cloth  6 each Topical Daily  ? dorzolamide  1 drop Left Eye BID  ? famotidine  20 mg Oral Daily  ? FLUoxetine  40 mg Oral Daily  ? heparin  5,000 Units Subcutaneous Q8H  ? levETIRAcetam  500 mg Oral BID  ? metoprolol tartrate  25 mg Oral BID  ? ? ?PRN meds: ?sodium chloride, acetaminophen, docusate sodium, polyethylene glycol, polyvinyl alcohol  ? ?Antimicrobials: ?Anti-infectives (From admission, onward)  ? ? Start     Dose/Rate  Route Frequency Ordered Stop  ? 04/17/21 2200  vancomycin (VANCOREADY) IVPB 1250 mg/250 mL  Status:  Discontinued       ? 1,250 mg ?166.7 mL/hr over 90 Minutes Intravenous Every 48 hours 04/16/21 0059 04/16/21 0927  ? 04/16/21 2200  ceFEPIme (MAXIPIME) 2 g in sodium chloride 0.9 % 100 mL IVPB  Status:  Discontinued       ? 2 g ?200 mL/hr over 30 Minutes Intravenous Every 24 hours 04/16/21 0059 04/16/21 0927  ? 04/16/21 2200  cefTRIAXone (ROCEPHIN) 2 g in sodium chloride 0.9 % 100 mL IVPB       ? 2 g ?200 mL/hr over 30 Minutes Intravenous Every 24 hours 04/16/21 0927 04/20/21 2359  ? 04/15/21 2300  ceFEPIme (MAXIPIME) 2 g in sodium  chloride 0.9 % 100 mL IVPB       ? 2 g ?200 mL/hr over 30 Minutes Intravenous  Once 04/15/21 2247 04/16/21 0108  ? 04/15/21 2300  vancomycin (VANCOCIN) IVPB 1000 mg/200 mL premix  Status:  Discontinued       ? 1,000 mg ?200 mL/hr over 60 Minutes Intravenous  Once 04/15/21 2247 04/15/21 2252  ? 04/15/21 2300  metroNIDAZOLE (FLAGYL) IVPB 500 mg       ? 500 mg ?100 mL/hr over 60 Minutes Intravenous  Once 04/15/21 2247 04/16/21 0128  ? 04/15/21 2300  vancomycin (VANCOREADY) IVPB 1500 mg/300 mL       ? 1,500 mg ?150 mL/hr over 120 Minutes Intravenous  Once 04/15/21 2252 04/16/21 0329  ? ?  ? ? ?Objective: ?Vitals:  ? 04/19/21 0338 04/19/21 0340  ?BP: (!) 187/76 (!) 162/89  ?Pulse: 81 71  ?Resp: 18   ?Temp: (!) 97.4 ?F (36.3 ?C)   ?SpO2: 97%   ? ? ?Intake/Output Summary (Last 24 hours) at 04/19/2021 1349 ?Last data filed at 04/19/2021 0341 ?Gross per 24 hour  ?Intake 25.45 ml  ?Output 200 ml  ?Net -174.55 ml  ? ?Filed Weights  ? 04/16/21 0745 04/18/21 0500 04/19/21 0442  ?Weight: 72.7 kg 74.6 kg 73.5 kg  ? ?Weight change: -1.1 kg ?Body mass index is 25.38 kg/m?.  ? ?Physical Exam: ?General exam: Pleasant, alert, awake, comfortable.  Mild gurgling noted in the throat ?I did not do a detailed examination because of Comfort Care status ? ?Data Review: I have personally reviewed the laboratory data and studies available. ? ?F/u labs  ?Unresulted Labs (From admission, onward)  ? ? None  ? ?  ? ? ?Signed, ?Terrilee Croak, MD ?[facility] ?04/19/2021 ? ? ? ? ? ? ? ? ? ? ?  ?

## 2021-04-19 NOTE — Plan of Care (Signed)
?  Problem: Clinical Measurements: ?Goal: Will remain free from infection ?Outcome: Not Progressing ?  ?Problem: Clinical Measurements: ?Goal: Diagnostic test results will improve ?Outcome: Not Progressing ?  ?Problem: Clinical Measurements: ?Goal: Respiratory complications will improve ?Outcome: Not Progressing ?  ?Problem: Nutrition: ?Goal: Adequate nutrition will be maintained ?Outcome: Not Progressing ?  ?Problem: Safety: ?Goal: Ability to remain free from injury will improve ?Outcome: Not Progressing ?  ?Problem: Skin Integrity: ?Goal: Risk for impaired skin integrity will decrease ?Outcome: Not Progressing ?  ?

## 2021-04-19 NOTE — Consult Note (Signed)
? ?Palliative Care Consult Note  ?                                ?Date: 04/19/2021  ? ?Patient Name: Luis Wolfe  ?DOB: 1941-01-18  MRN: [id number]  Age / Sex: 81 y.o., male  ?PCP: Caryl Bis, MD ?Referring Physician: Terrilee Croak, MD ? ?Reason for Consultation: Establishing goals of care ? ?HPI/Patient Profile: 81 y.o. male  with past medical history of multiple medical issues including HTN, HLD, Type 2 DM, Alzheimer's dementia who was living at home with his wife.  According to his wife, he has been progressively declining in his physical and mental health over the last few years to a point where he is now total care.  Family was in process of starting hospice care for him through his PCP. ?On 3/13, patient was found down on the floor for unknown duration.  EMS found hypoxic and brought to the ED on bag mask ventilation. In the ED, patient was unresponsive hypoxic, subsequently intubated and admitted to ICU. He was also found to be febrile on admission with leukocytosis. He was managed for pneumonia, successfully extubated. ? ?PMT was consulted for [place] conversations related to advanced dementia with progressive decline now with rapid decline due to acute illness. ? ?Past Medical History:  ?Diagnosis Date  ?? Allergy   ?? Anxiety   ?? Benign neoplasm of colon   ?? Borderline diabetic   ? no meds, diet controlled, MD is following pt  ?? Bursitis of shoulder   ? Hx - resolved   ?? Cataract   ? Had surgery - bilateral  ?? Coronary artery disease   ?? Dental bridge present   ? perm upper bridge, lower bottom partial  ?? Depression   ?? Esophageal reflux   ?? External hemorrhoids without mention of complication   ?? Gout, unspecified   ?? Hematoma   ? hematoma after spine surgery  ?? Hyperlipidemia   ?? Hypertension   ?? Incontinence of feces   ? History - resolved, no current problems  ?? Memory loss   ? Dementia  ?? Nocturia   ?? Other postprocedural  status(V45.89)   ? umbilical Heriorrhapy  ?? Overweight(278.02)   ? Hx - current weight 213.4lb as of 08/16/15  ?? Pancreatitis   ? hx of  ?? Personal history of other diseases of digestive system   ? diverticulosis colon  ?? Pneumonia   ? Hx - resolved history of hospital /ventilator-acquired pneumonia  ?? Rhinitis   ? occasional  ?? Spondylolysis   ? ? ?Subjective:  ? ?This NP [place] reviewed medical records, received report from team, assessed the patient and then meet at the patient's bedside to discuss diagnosis, prognosis, GOC, EOL wishes disposition and options. ? ?I met with the patient who is not able to have meaningful conversations, essentially non-verbal to me although alert. Shortly thereafter I had a conversation with his wife at the bedside.  Later in the day I returned to the patient's bedside to meet with the patient's wife, her son, granddaughter along with Dr. Pietro Cassis from [facility]. ?  ?Concept of Palliative Care was introduced as specialized medical care for people and their families living with serious illness.  If focuses on providing relief from the symptoms and stress of a serious illness.  The goal is to improve quality of life for both the patient and the family. Values  and goals of care important to patient and family were attempted to be elicited. ? ?Created space and opportunity for patient  and family to explore thoughts and feelings regarding current medical situation ?  ?Natural trajectory and current clinical status were discussed. Questions and concerns addressed. Patient  encouraged to call with questions or concerns.   ? ?Patient/Family Understanding of Illness: ?His wife shares that he was diagnosed with dementia 20 years ago.  She first noted minor memory changes that were able to be "explained away" but is since progressed she noticed ongoing memory issues and he was officially diagnosed with Alzheimer's.  He has had some slow decline over the past 20 years.  However,  his decline became more rapid over the past 3 years.  They note that he was found unresponsive and brought to the emergency room and intubated for pneumonia, questionable seizure.  He is now off the breathing tube. ? ?We had a discussion about trajectory of illness and progressive nature of Alzheimer's, irreversible nature of Alzheimer's and affected acute illness can exacerbate a rapid decline in Alzheimer's dementia.  I shared that he appears to be there now.  We are concerned about aspiration given his admission for pneumonia and during my visit with his wife he was noted to clear his throat 4-6 times, cough a couple few times.  We discussed that this is more consistent with aspiration of saliva secretions given that he has not eating currently.  We discussed that this could put him at high risk for recurrent pneumonia and repeated hospitalizations.  I answered all her questions about his current health situation. ? ?Life Review: ?He has been married to his wife for 60 years.  They have a son and a granddaughter.  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 ? ?Patient Values: ?Family, quality ? ?Goals: ?At the end of our family meeting the goal is now to discharge home with hospice ? ?Today's Discussion: ?At baseline she shares that he is essentially full care for all ADLs.  He has required use of a wheelchair to get around but has had more progressive difficulty with standing over the past several days.  Previous to hospital admission he was eating just fine.  She states that she has been trying to get him better but things happen.  For example, he tried to help her get rid of scraps from the kitchen and when he did not return for some time she went looking for him.  She found that he had dumped describes behind the shed and then wandered onto a path encircling a pond near their property which he fell into  and was in the water holding wet.  He occasionally slides out of the wheelchair and slides out of the bed and will refuse to get up until he is ready.  She has been having him ride a stationary bike to stay active and recently he did have a fall off a stationary bike. ? ?I took the time to acknowledge her for the wonderful care she is provided to her husband.  We discussed the difficulties of being a caregiver.  This is compounded by the fact that her nephew has moved into the basement and this is put additional stress on her as she often has to care for him and his dog. ? ?Additionally, we further discussed his current medical situation.  I reviewed  what was discussed with his wife with his son after they arrived.  I also offered that the patient generally does not eat for the hospital staff and will only eat if provided by his family.  We discussed that there are always options.  One option would be to continue with aggressive medical care in which case we would need to do a swallow evaluation to determine his presence of dysphagia.  This would also involve repeated hospitalizations for any pneumonia or acute illness.  We also discussed the option to shift toward a focus on comfort, quality, dignity.  We reviewed the hospice philosophy.  After our discussions they have elected to proceed with home hospice.  They are aware that residential hospice could eventually be an option, if needed, in the future.  However it seems that her goal at this point is to allow him to be at home where he is familiar and comfortable. ? ?I provided emotional general supportive therapeutic listening, therapeutic touch, empathy, sharing of stories, humor, and other techniques.  I answered all questions and addressed all concerns to the best my ability. ? ?Review of Systems  ?Unable to perform ROS: Dementia  ? ?Objective:  ? ?Primary Diagnoses: ?Present on Admission: ?? Respiratory failure ([place]) ? ? ?Physical Exam ?Vitals and nursing  note reviewed.  ?Constitutional:   ?   General: He is not in acute distress. ?   Appearance: He is ill-appearing.  ?HENT:  ?   Head: Normocephalic and atraumatic.  ?Pulmonary:  ?   Effort: Pulmonary effort is norma

## 2021-04-19 NOTE — TOC Initial Note (Signed)
Transition of Care (TOC) - Initial/Assessment Note  ? ? ?Patient Details  ?Name: Luis Wolfe ?MRN: [id number] ?Date of Birth: 06-10-1940 ? ?Transition of Care (TOC) CM/SW Contact:    ?Marilu Favre, RN ?Phone Number: ?04/19/2021, 2:58 PM ? ?Clinical Narrative:                 ?Spoke to patient's wife Braheem Tomasik [phone] . Discussed home with hospice. Dyann Ruddle would like to take [place] home with [facility] . Patient will need hospital bed delivered to home prior to discharge. Hospice will call Dyann Ruddle and discuss DME . NCM confirmed address.  ? ?Referral given to [facility] with [facility] .  ? ?Expected Discharge Plan: [place] ?  ? ? ?Patient Goals and CMS Choice ?  ?  ?Choice offered to / list presented to : Spouse ? ?Expected Discharge Plan and Services ?Expected Discharge Plan: [place] ?  ?Discharge Planning Services: CM Consult ?  ?Living arrangements for the past 2 months: [place] ?                ?  ?  ?  ?  ?  ?HH Arranged: RN ?[place] Agency: [facility] ?Date [place]: 04/19/21 ?Time [place]: 5035 ?Representative spoke with at [place]: [place] ? ?Prior Living Arrangements/Services ?Living arrangements for the past 2 months: [place] ?Lives with:: Spouse ?  ?       ?  ?  ?  ?  ? ?Activities of Daily Living ?  ?ADL Screening (condition at time of admission) ?Patient's cognitive ability adequate to safely complete daily activities?: Yes ?Is the patient deaf or have difficulty hearing?: No ?Does the patient have difficulty seeing, even when wearing glasses/contacts?: No ?Does the patient have difficulty concentrating, remembering, or making decisions?: Yes ?Patient able to express need for assistance with ADLs?: Yes ?Does the patient have difficulty dressing or bathing?: Yes ?Independently performs ADLs?: Yes (appropriate for developmental age) ?Does the patient have difficulty walking or climbing stairs?:  Yes ?Weakness of Legs: None ?Weakness of Arms/Hands: None ? ?Permission Sought/Granted ?  ?  ?   ?   ?   ?   ? ?Emotional Assessment ?  ?  ?  ?  ?  ?  ? ?Admission diagnosis:  Respiratory failure ([place]) [J96.90] ?Acute respiratory failure, unspecified whether with hypoxia or hypercapnia ([place]) [J96.00] ?Patient Active Problem List  ? Diagnosis Date Noted  ? Respiratory failure ([place]) 04/16/2021  ? CORONARY ARTERY DISEASE, S/P PTCA 03/19/2010  ? SHORTNESS OF BREATH 02/19/2010  ? NONSPECIFIC ABNORMAL UNSPEC CV FUNCTION STUDY 02/19/2010  ? OVERWEIGHT/OBESITY 11/20/2008  ? INCONTINENCE OF FECES 08/24/2007  ? NEOPLASM, BENIGN, COLON 04/13/2007  ? HYPERLIPIDEMIA 04/13/2007  ? GOUT 04/13/2007  ? ANXIETY, CHRONIC 04/13/2007  ? HYPERTENSION 04/13/2007  ? CORONARY ARTERY DISEASE 04/13/2007  ? HEMORRHOIDS, EXTERNAL 04/13/2007  ? ALLERGIC RHINITIS 04/13/2007  ? GASTROESOPHAGEAL REFLUX DISEASE 04/13/2007  ? BURSITIS, RIGHT SHOULDER 04/13/2007  ? MEMORY LOSS 04/13/2007  ? PANCREATITIS, HX OF 04/13/2007  ? DIVERTICULOSIS, COLON, HX OF 04/13/2007  ? POLYPECTOMY, HX OF 04/13/2007  ? TONSILLECTOMY, HX OF 04/13/2007  ? PERCUTANEOUS TRANSLUMINAL CORONARY ANGIOPLASTY, HX OF 04/13/2007  ? Other postprocedural status(V45.89) 04/13/2007  ? ?PCP:  Caryl Bis, MD ?Pharmacy:   ?[place], [address] ?[place] [address] ?Phone: [phone] Fax: [phone] ? ? ? ? ?  Social Determinants of Health (SDOH) Interventions ?  ? ?Readmission Risk Interventions ?No flowsheet data found. ? ? ?

## 2021-04-20 DIAGNOSIS — Z87898 Personal history of other specified conditions: Secondary | ICD-10-CM

## 2021-04-20 DIAGNOSIS — F418 Other specified anxiety disorders: Secondary | ICD-10-CM | POA: Diagnosis present

## 2021-04-20 DIAGNOSIS — G9341 Metabolic encephalopathy: Secondary | ICD-10-CM | POA: Diagnosis present

## 2021-04-20 LAB — CULTURE, BLOOD (ROUTINE X 2)
Culture: NO GROWTH
Special Requests: ADEQUATE

## 2021-04-20 NOTE — TOC Progression Note (Signed)
Transition of Care (TOC) - Progression Note  ? ? ?Patient Details  ?Name: Luis Wolfe ?MRN: [id number] ?Date of Birth: 02-20-1940 ? ?Transition of Care (TOC) CM/SW Contact  ?Bartholomew Crews, RN ?Phone Number: [phone] ?04/20/2021, 4:54 PM ? ?Clinical Narrative:    ? ?Spoke with patient's spouse and hospice liaison by phone. DME has now been delivered to home. Will plan to arrange for transport at 9am tomorrow morning, and [facility] to admit to home hospice services tomorrow after patient arrives at home.  ? ?Expected Discharge Plan: [place] ?  ? ?Expected Discharge Plan and Services ?Expected Discharge Plan: [place] ?  ?Discharge Planning Services: CM Consult ?  ?Living arrangements for the past 2 months: [place] ?                ?  ?  ?  ?  ?  ?HH Arranged: RN ?[place] Agency: [facility] ?Date [place]: 04/19/21 ?Time [place]: 7824 ?Representative spoke with at [place]: [place] ? ? ?Social Determinants of Health (SDOH) Interventions ?  ? ?Readmission Risk Interventions ?No flowsheet data found. ? ?

## 2021-04-20 NOTE — TOC Progression Note (Addendum)
Transition of Care (TOC) - Progression Note  ? ? ?Patient Details  ?Name: Luis Wolfe ?MRN: [id number] ?Date of Birth: Jun 06, 1940 ? ?Transition of Care (TOC) CM/SW Contact  ?Bartholomew Crews, RN ?Phone Number: [phone] ?04/20/2021, 10:30 AM ? ?Clinical Narrative:    ? ?Spoke with liaison at [place] for home hospice services. Referral information provided telephonically and via fax. Liaison to follow up with wife concerning DME and will request delivery for today - will likely be this evening.  ? ?Update: Notified by Horris Latino at [facility] [place]. Horris Latino has left a message for spouse to return her call. She cannot order DME until speaking with spouse. Spoke with spouse at the bedside, spouse noted that she did have a message from [place] and is returning call now. Patient will need ambulance transport at discharge.  ? ?Expected Discharge Plan: [place] ?  ? ?Expected Discharge Plan and Services ?Expected Discharge Plan: [place] ?  ?Discharge Planning Services: CM Consult ?  ?Living arrangements for the past 2 months: [place] ?                ?  ?  ?  ?  ?  ?HH Arranged: RN ?[place] Agency: [facility] ?Date [place]: 04/19/21 ?Time [place]: 5638 ?Representative spoke with at [place]: [place] ? ? ?Social Determinants of Health (SDOH) Interventions ?  ? ?Readmission Risk Interventions ?No flowsheet data found. ? ?

## 2021-04-20 NOTE — Progress Notes (Signed)
?PROGRESS NOTE ? ? ? ?Luis Wolfe  VVO:[id number] DOB: [birth year] DOA: 04/15/2021 ?PCP: Caryl Bis, MD  ? ?  ?Brief Narrative:  ?81 year old male with history of hypertension, hyperlipidemia, diabetes, Alzheimer's dementia who is living with his wife presenting after being found down on the floor for an unknown period of time.  He was hypoxic when EMS found him and initially intubated.  After successful management for pneumonia, he was extubated and transferred out of the ICU on 3/17.  Plan is to send him home with home hospice. ? ? ?New events last 24 hours / Subjective: ?No changes, resting peacefully in bed.  Does not talk much, answer some yes and no questions.  Says yes to being comfortable. ? ?Assessment & Plan: ?  ?Principal Problem: ?  Respiratory failure ([place]) ?Active Problems: ?  GERD (gastroesophageal reflux disease) ?  Acute metabolic encephalopathy ?  History of seizures ?  Depression with anxiety ? ?Comfort care measures ? Home with hospice, likely tomorrow ? DC unnecessary nursing protocols and heparin ? ?GERD ? Pepcid 20 mg daily ? ?Seizures ? Keppra 500 mg twice daily ? ?Hypertension ? Lopressor 25 mg twice daily ? ?Depression/anxiety ? BuSpar 15 mg twice daily, Prozac 40 mg daily ? ?DVT prophylaxis: SCDs ?Code Status: DNR ?Family Communication: Wife was at bedside ?Coming From: Home ?Disposition Plan: Home w hospice ?Barriers to Discharge: Hospice set up ? ?Consultants:  ?Palliative Care ? ?Procedures:  ?None ? ?Antimicrobials:  ?Anti-infectives (From admission, onward)  ? ? Start     Dose/Rate Route Frequency Ordered Stop  ? 04/17/21 2200  vancomycin (VANCOREADY) IVPB 1250 mg/250 mL  Status:  Discontinued       ? 1,250 mg ?166.7 mL/hr over 90 Minutes Intravenous Every 48 hours 04/16/21 0059 04/16/21 0927  ? 04/16/21 2200  ceFEPIme (MAXIPIME) 2 g in sodium chloride 0.9 % 100 mL IVPB  Status:  Discontinued       ? 2 g ?200 mL/hr over 30 Minutes Intravenous Every 24 hours 04/16/21 0059 04/16/21  0927  ? 04/16/21 2200  cefTRIAXone (ROCEPHIN) 2 g in sodium chloride 0.9 % 100 mL IVPB       ? 2 g ?200 mL/hr over 30 Minutes Intravenous Every 24 hours 04/16/21 0927 04/20/21 2359  ? 04/15/21 2300  ceFEPIme (MAXIPIME) 2 g in sodium chloride 0.9 % 100 mL IVPB       ? 2 g ?200 mL/hr over 30 Minutes Intravenous  Once 04/15/21 2247 04/16/21 0108  ? 04/15/21 2300  vancomycin (VANCOCIN) IVPB 1000 mg/200 mL premix  Status:  Discontinued       ? 1,000 mg ?200 mL/hr over 60 Minutes Intravenous  Once 04/15/21 2247 04/15/21 2252  ? 04/15/21 2300  metroNIDAZOLE (FLAGYL) IVPB 500 mg       ? 500 mg ?100 mL/hr over 60 Minutes Intravenous  Once 04/15/21 2247 04/16/21 0128  ? 04/15/21 2300  vancomycin (VANCOREADY) IVPB 1500 mg/300 mL       ? 1,500 mg ?150 mL/hr over 120 Minutes Intravenous  Once 04/15/21 2252 04/16/21 0329  ? ?  ? ? ? ?Objective: ?Vitals:  ? 04/19/21 2118 04/20/21 0424 04/20/21 0500 04/20/21 1447  ?BP: (!) 150/72 (!) 158/82  (!) 157/72  ?Pulse: 82 79  66  ?Resp: '20 18  17  '$ ?Temp: 98 ?F (36.7 ?C) 97.9 ?F (36.6 ?C)  98.1 ?F (36.7 ?C)  ?TempSrc: Axillary Axillary    ?SpO2: 97% 92%  95%  ?Weight:   74.2  kg   ?Height:      ? ? ?Intake/Output Summary (Last 24 hours) at 04/20/2021 1647 ?Last data filed at 04/20/2021 1330 ?Gross per 24 hour  ?Intake 280 ml  ?Output 1200 ml  ?Net -920 ml  ? ?Filed Weights  ? 04/18/21 0500 04/19/21 0442 04/20/21 0500  ?Weight: 74.6 kg 73.5 kg 74.2 kg  ? ? ?Examination:  ?General exam: Appears calm and comfortable  ?Respiratory system: Clear to auscultation. Respiratory effort normal. No respiratory distress. No conversational dyspnea.  ?Cardiovascular system: S1 & S2 heard, RRR.No pedal edema. ?Central nervous system: No focal neurological deficits.  ?Extremities: Symmetric in appearance  ?Psychiatry: Judgement and insight appear limited.  Mood & affect appropriate.  ? ?Data Reviewed: I have personally reviewed following labs and imaging studies ? ?CBC: ?Recent Labs  ?Lab 04/15/21 ?2311  04/15/21 ?2312 04/15/21 ?2337 04/16/21 ?0323 04/17/21 ?0932 04/18/21 ?0131 04/19/21 ?0143  ?WBC 19.1*  --   --  12.3* 11.8* 13.8* 11.3*  ?NEUTROABS 16.8*  --   --   --   --   --   --   ?HGB 11.5*   < > 12.2*  12.2* 10.5* 10.3* 10.5* 9.7*  ?HCT 36.4*   < > 36.0*  36.0* 34.3* 31.9* 31.9* 30.4*  ?MCV 92.9  --   --  95.0 91.1 89.4 90.7  ?PLT 371  --   --  213 175 206 202  ? < > = values in this interval not displayed.  ? ?Basic Metabolic Panel: ?Recent Labs  ?Lab 04/15/21 ?2311 04/15/21 ?2312 04/15/21 ?2337 04/16/21 ?0323 04/17/21 ?3557 04/18/21 ?0131 04/19/21 ?0143  ?NA 133*   < > 135  135 134* 134* 135 135  ?K 5.0   < > 5.0  5.0 4.2 3.8 3.0* 3.7  ?CL 102  --  103 110 105 104 106  ?CO2 21*  --   --  13* 17* 20* 21*  ?GLUCOSE 138*  --  138* 123* 118* 107* 123*  ?BUN 36*  --  38* 38* 36* 33* 27*  ?CREATININE 2.43*  --  2.40* 2.12* 1.48* 1.40* 1.23  ?CALCIUM 8.6*  --   --  7.9* 8.3* 8.5* 8.4*  ?MG  --   --   --  2.1 2.0 2.0 1.9  ?PHOS  --   --   --  3.3  --   --  1.2*  ? < > = values in this interval not displayed.  ? ?GFR: ?Estimated Creatinine Clearance: 45.5 mL/min (by C-G formula based on SCr of 1.23 mg/dL). ? ?Liver Function Tests: ?Recent Labs  ?Lab 04/15/21 ?2311  ?AST 354*  ?ALT 228*  ?ALKPHOS 58  ?BILITOT 0.9  ?PROT 6.4*  ?ALBUMIN 3.2*  ? ?Recent Labs  ?Lab 04/15/21 ?2311  ?AMMONIA 27  ? ?Coagulation Profile: ?Recent Labs  ?Lab 04/16/21 ?0323  ?INR 1.3*  ? ?CBG: ?Recent Labs  ?Lab 04/17/21 ?1928 04/17/21 ?2354 04/18/21 ?0336 04/18/21 ?0739 04/18/21 ?1100  ?GLUCAP 111* 102* 86 87 86  ? ?Sepsis Labs: ?Recent Labs  ?Lab 04/15/21 ?2311 04/16/21 ?0323  ?LATICACIDVEN 2.0* 2.3*  ? ? ?Recent Results (from the past 240 hour(s))  ?Resp Panel by RT-PCR (Flu A&B, Covid) Nasopharyngeal Swab     Status: None  ? Collection Time: 04/15/21 10:46 PM  ? Specimen: Nasopharyngeal Swab; Nasopharyngeal(NP) swabs in vial transport medium  ?Result Value Ref Range Status  ? SARS Coronavirus 2 by RT PCR NEGATIVE NEGATIVE Final  ?   Comment: (NOTE) ?SARS-CoV-2 target nucleic acids  are NOT DETECTED. ? ?The SARS-CoV-2 RNA is generally detectable in upper respiratory ?specimens during the acute phase of infection. The lowest ?concentration of SARS-CoV-2 viral copies this assay can detect is ?138 copies/mL. A negative result does not preclude SARS-Cov-2 ?infection and should not be used as the sole basis for treatment or ?other patient management decisions. A negative result may occur with  ?improper specimen collection/handling, submission of specimen other ?than nasopharyngeal swab, presence of viral mutation(s) within the ?areas targeted by this assay, and inadequate number of viral ?copies(<138 copies/mL). A negative result must be combined with ?clinical observations, patient history, and epidemiological ?information. The expected result is Negative. ? ?Fact Sheet for Patients:  ?EntrepreneurPulse.com.au ? ?Fact Sheet for Healthcare Providers:  ?IncredibleEmployment.be ? ?This test is no t yet approved or cleared by the [place] FDA and  ?has been authorized for detection and/or diagnosis of SARS-CoV-2 by ?FDA under an Emergency Use Authorization (EUA). This EUA will remain  ?in effect (meaning this test can be used) for the duration of the ?COVID-19 declaration under Section 564(b)(1) of the Act, 21 ?U.S.C.section 360bbb-3(b)(1), unless the authorization is terminated  ?or revoked sooner.  ? ? ?  ? Influenza A by PCR NEGATIVE NEGATIVE Final  ? Influenza B by PCR NEGATIVE NEGATIVE Final  ?  Comment: (NOTE) ?The Xpert Xpress SARS-CoV-2/FLU/RSV plus assay is intended as an aid ?in the diagnosis of influenza from Nasopharyngeal swab specimens and ?should not be used as a sole basis for treatment. Nasal washings and ?aspirates are unacceptable for Xpert Xpress SARS-CoV-2/FLU/RSV ?testing. ? ?Fact Sheet for Patients: ?EntrepreneurPulse.com.au ? ?Fact Sheet for Healthcare  Providers: ?IncredibleEmployment.be ? ?This test is not yet approved or cleared by the [place] FDA and ?has been authorized for detection and/or diagnosis of SARS-CoV-2 by ?FDA under an Emergency Use Authorizat

## 2021-04-21 NOTE — Progress Notes (Signed)
Patient discharged per order via stretcher by [facility] to home. Patient care provided along with removal of PIV. No signs of acute distress noted or voiced.  ?

## 2021-04-21 NOTE — Discharge Instructions (Signed)

## 2021-04-21 NOTE — TOC Transition Note (Signed)
Transition of Care (TOC) - CM/SW Discharge Note ? ? ?Patient Details  ?Name: Luis Wolfe ?MRN: [id number] ?Date of Birth: [birth year] ? ?Transition of Care (TOC) CM/SW Contact:  ?Bartholomew Crews, RN ?Phone Number: [phone] ?04/21/2021, 8:26 AM ? ? ?Clinical Narrative:    ? ?Patient to transition home with home hospice services through [facility] summary complete and faxed to [phone]. Spoke with Horris Latino at [facility] to confirm readiness. Spoke with patient's spouse to confirm readiness and home address. Chat message to nursing to confirm transport arrangements for 9am. [facility] arranged for 9am pick up. Medical necessity form completed and printed at nursing station. No further TOC needs identified at this time.  ? ?Final next level of care: [facility] ?Barriers to Discharge: No Barriers Identified ? ? ?Patient Goals and CMS Choice ?Patient states their goals for this hospitalization and ongoing recovery are:: return home with hospice services ?CMS Medicare.gov Compare Post Acute Care list provided to:: Patient ?Choice offered to / list presented to : Spouse ? ?Discharge Placement ?  ?           ?  ?  ?  ?  ? ?Discharge Plan and Services ?  ?Discharge Planning Services: CM Consult ?           ?DME Arranged: N/A ?DME Agency: NA ?  ?  ?  ?HH Arranged: RN ?[place] Agency: [facility] ?Date [place]: 04/21/21 ?Time [place]: [phone] ?Representative spoke with at [facility]: Horris Latino ? ?Social Determinants of Health (SDOH) Interventions ?  ? ? ?Readmission Risk Interventions ?No flowsheet data found. ? ? ? ? ?

## 2021-04-21 NOTE — Plan of Care (Signed)

## 2021-04-21 NOTE — Discharge Summary (Addendum)
Physician Discharge Summary  ?Luis Wolfe DJM:[id number] DOB: Sep 26, 1940 DOA: 04/15/2021 ? ?PCP: Caryl Bis, MD ? ?Admit date: 04/15/2021 ?Discharge date: 04/21/2021 ? ?Admitted From: Home ?Disposition: Home with hospice ? ?Recommendations for Outpatient Follow-up:  ?Home with hospice, no hosp follow up necessary.  ? ?Home Health: [facility] ?Equipment/Devices: Hospital bed ? ?Discharge Condition: Fair ?CODE STATUS: DNR ?Diet recommendation: General ? ?Brief/Interim Summary: ?From H&P: From Dr. Lenice Llamas- "Luis Wolfe is a 81 y.o. man with multiple medical issues including HTN, HLD, Type 2 DM, Alzheimer's dementia. Was found down at home by wife for unknown amount of time. History is limited obtained from wife who is at bedside.  Patient has had an unfortunate decline in his health and mental status over the past few years.  His wife notes that he has not been in his right mind for many many years.  He is 100% fully dependent on her for all ADLs.  On most days he does not know who she is.  Apparently family doctor had started talking about hospice as an option.  His wife notes that he was in his usual state of health yesterday, eating okay, not having any complaints or distress.  Earlier today he was found down on the floor for an unknown amount of time.  Apparently he likes to lay on the ground for fun and that is with the life that he was doing.  When he was not arousable she called EMS.  He was found to be hypoxic on their arrival and intermittently required bag-valve-mask ventilation.  Upon arrival to the ED he was unresponsive, hypoxic, and respiratory distress.  He was emergently intubated.  Received etomidate and succinylcholine for intubation. Otherwise not on any continuous medications.  He was found to be febrile on admission with leukocytosis and left shift.  PCCM was consulted to admit to ICU." ? ?Interim: He was managed on a ventilator received IV antibiotics in the ICU.  Cefepime  and Vancomycin were stopped on 3/14. He clinically improved and was extubated successfully on 3/17, subsequently being transferred to the floor.  He and his wife met with the palliative care team and decided on going home with hospice. ? ?Subjective on day of discharge: Patient is by himself resting in bed.  He is nonverbal.  Appears comfortable. ? ?Discharge Diagnoses:  ?Principal Problem: ?  Respiratory failure ([place]) ?Active Problems: ?  Essential hypertension ?  GERD (gastroesophageal reflux disease) ?  Acute metabolic encephalopathy ?  History of seizures ?  Depression with anxiety ? ?Respiratory failure secondary to pneumonia ? Resolved ? Antibiotics have been completed ? ?Severe Sepsis ? Resolved as no longer having WBC>12, fevers, HR elevation or increased RR ? ?Hypertension ? Will continue Lopressor 25 mg twice daily ? Stop lisinopril 10 mg daily ? ?History of seizures ? New Keppra 500 mg twice daily ? ?GERD ? Continue Pepcid 20 mg daily as needed ? ?Depression/anxiety ? Continue BuSpar 50 mg twice daily, Prozac 40 mg daily ? ?Discharge Instructions ? ?Discharge Instructions   ? ? Diet general   Complete by: As directed ?  ? Increase activity slowly   Complete by: As directed ?  ? No wound care   Complete by: As directed ?  ? ?  ? ?Allergies as of 04/21/2021   ? ?   Reactions  ? Ibuprofen Swelling  ? Tamsulosin Other (See Comments)  ? hallucinations  ? Ibuprofen Swelling  ? ?  ? ?  ?Medication  List  ?  ? ?STOP taking these medications   ? ?aspirin 325 MG tablet ?  ?Body/Hair/Skin/Nails Caps ?  ?lisinopril 10 MG tablet ?Commonly known as: ZESTRIL ?  ?Vitamin D3 50 MCG (2000 UT) Tabs ?  ? ?  ? ?TAKE these medications   ? ?buPROPion 150 MG 24 hr tablet ?Commonly known as: WELLBUTRIN XL ?Take 150 mg by mouth daily. ?  ?busPIRone 15 MG tablet ?Commonly known as: BUSPAR ?Take 15 mg by mouth 2 (two) times daily. ?  ?carboxymethylcellulose 0.5 % Soln ?Commonly known as: REFRESH PLUS ?Place 1 drop into both eyes daily  as needed (dry eyes). ?  ?dorzolamide 2 % ophthalmic solution ?Commonly known as: TRUSOPT ?Place 1 drop into the left eye 2 (two) times daily. ?  ?EQ ClearLax 17 GM/SCOOP powder ?Generic drug: polyethylene glycol powder ?Take 17 g by mouth daily as needed for constipation. ?  ?famotidine 20 MG tablet ?Commonly known as: PEPCID ?Take 20 mg by mouth daily. ?  ?FLUoxetine 40 MG capsule ?Commonly known as: PROZAC ?Take 40 mg by mouth daily. ?  ?levETIRAcetam 500 MG tablet ?Commonly known as: KEPPRA ?Take 500 mg by mouth 2 (two) times daily. ?  ?magnesium oxide 400 MG tablet ?Commonly known as: MAG-OX ?Take 400 mg by mouth at bedtime. ?  ?Melatonin 10 MG Tabs ?Take 10 mg by mouth at bedtime. ?  ?metoprolol tartrate 50 MG tablet ?Commonly known as: LOPRESSOR ?Take 50 mg by mouth 2 (two) times daily. ?  ?VITAMIN B 12 PO ?Take 2,500 mcg by mouth daily. ?  ? ?  ? ? ?Allergies  ?Allergen Reactions  ? Ibuprofen Swelling  ? Tamsulosin Other (See Comments)  ?  hallucinations  ? Ibuprofen Swelling  ? ? ?Consultations: ?CCM ?Palliative Care ? ? ?Procedures/Studies: ?CT HEAD WO CONTRAST (5MM) ? ?Result Date: 04/16/2021 ?CLINICAL DATA:  Altered mental status EXAM: CT HEAD WITHOUT CONTRAST TECHNIQUE: Contiguous axial images were obtained from the base of the skull through the vertex without intravenous contrast. RADIATION DOSE REDUCTION: This exam was performed according to the departmental dose-optimization program which includes automated exposure control, adjustment of the mA and/or kV according to patient size and/or use of iterative reconstruction technique. COMPARISON:  07/27/2020 FINDINGS: Brain: No evidence of acute infarction, hemorrhage, cerebral edema, mass, mass effect, or midline shift. No hydrocephalus or extra-axial fluid collection. Periventricular white matter changes, likely the sequela of chronic small vessel ischemic disease. Unchanged degree of cerebral atrophy. Vascular: No hyperdense vessel. Atherosclerotic  calcifications in the intracranial carotid and vertebral arteries. Skull: Normal. Negative for fracture or focal lesion. Sinuses/Orbits: Air-fluid level in the right maxillary sinus with bubbly fluid, with lesser air-fluid levels in the sphenoid sinuses bilaterally and left anterior ethmoid air cells. Mucosal thickening in the ethmoid air cells and frontal sinuses. Other: The mastoid air cells are well aerated. IMPRESSION: IMPRESSION 1. No acute intracranial process. No etiology seen for the patient's altered mental status. 2. Air-fluid levels in the right maxillary sinus, with bubbly fluid, as well as in the sphenoid sinuses and left anterior ethmoid air cells, which can be seen in the setting of acute sinusitis. Correlate with symptoms. Electronically Signed   By: Merilyn Baba M.D.   On: 04/16/2021 02:01  ? ?DG Chest Port 1 View ? ?Result Date: 04/15/2021 ?CLINICAL DATA:  Sepsis EXAM: PORTABLE CHEST 1 VIEW COMPARISON:  07/27/2020 FINDINGS: Single frontal view of the chest demonstrates endotracheal tube overlying tracheal air column, tip approximately 2.2 cm above carina. Enteric catheter  passes below diaphragm, tip excluded by collimation but side port projecting over the gastric fundus. Postsurgical changes from CABG. Cardiac silhouette is stable. Mild chronic central vascular congestion. Increased density in the retrocardiac region could reflect atelectasis or airspace disease. No effusion or pneumothorax. No acute bony abnormalities. IMPRESSION: 1. Support devices as above. 2. Retrocardiac density compatible with atelectasis or airspace disease. Minimal change since prior exam. 3. Mild chronic central vascular congestion. Electronically Signed   By: Randa Ngo M.D.   On: 04/15/2021 23:13  ? ?DG Abd Portable 1V ? ?Result Date: 04/16/2021 ?CLINICAL DATA:  Status post NG placement. EXAM: PORTABLE ABDOMEN - 1 VIEW COMPARISON:  Abdominal radiograph dated 07/29/2005. FINDINGS: Partially visualized enteric tube  with tip and side-port in the proximal stomach. Mildly dilated loop of small bowel in the left upper abdomen measuring 3.3 cm. Bibasilar pulmonary densities may represent atelectasis but concerning for infilt

## 2023-02-08 IMAGING — DX DG CHEST 1V PORT
1 series · 1 of 1 positions shown · non-contrast
Comparison: 04/04/2010

CLINICAL DATA: Altered mental status

EXAM:
PORTABLE CHEST 1 VIEW

[chest ap]
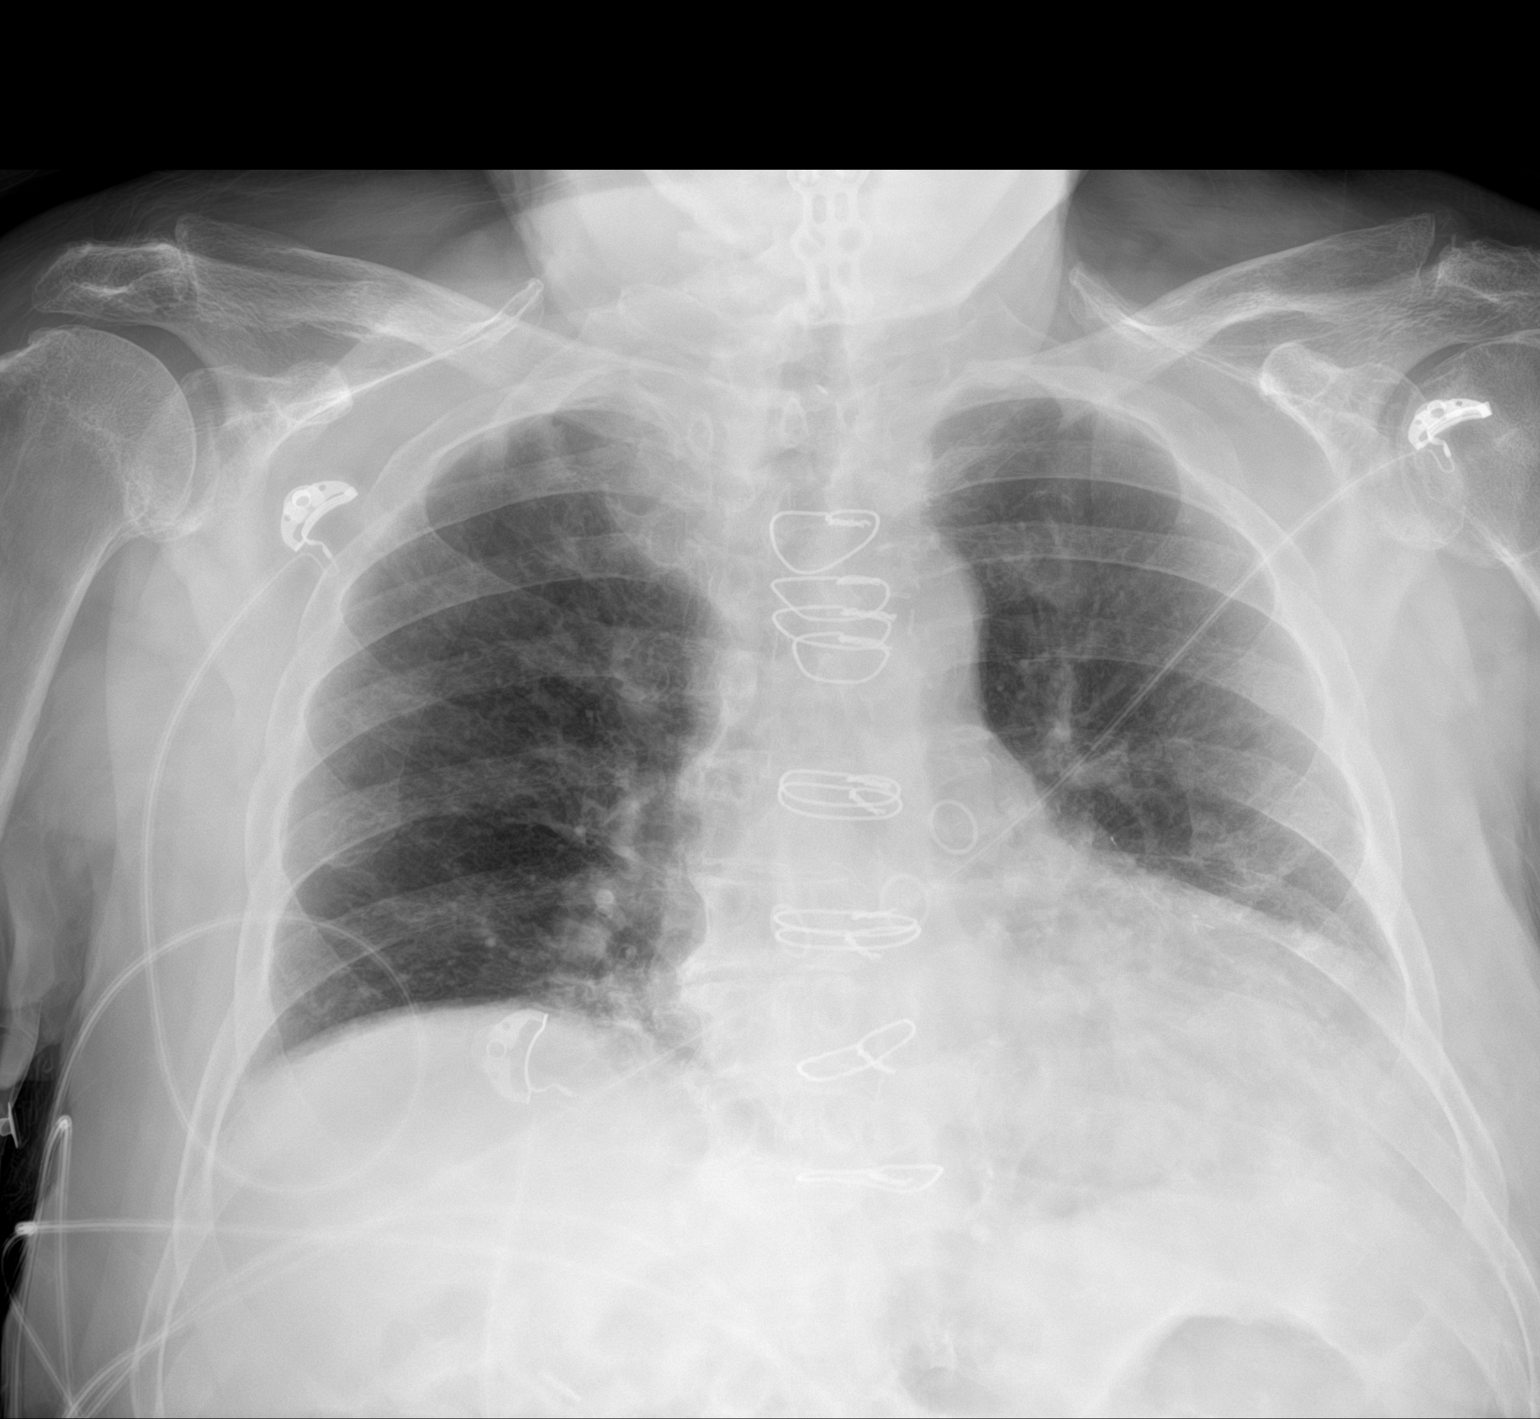

[1 of 1 positions shown; findings below may reference images not displayed]

FINDINGS: Post sternotomy changes. Cardiomegaly. Patchy airspace disease in
the left retrocardiac region. No pneumothorax.
IMPRESSION: 1. Cardiomegaly.
2. Patchy airspace disease in the left retrocardiac region, possible
pneumonia

## 2023-10-29 IMAGING — CT CT HEAD W/O CM
5 of 6 series · 17 of 47 positions shown, 18 images · non-contrast
Comparison: 07/27/2020

CLINICAL DATA: Altered mental status



[Series 3: head without · axial · non-contrast · 0.44mm/px · z∈[-111,-56]mm · 2 of 34 slices shown, 3 images]
[im 12/34  brain]
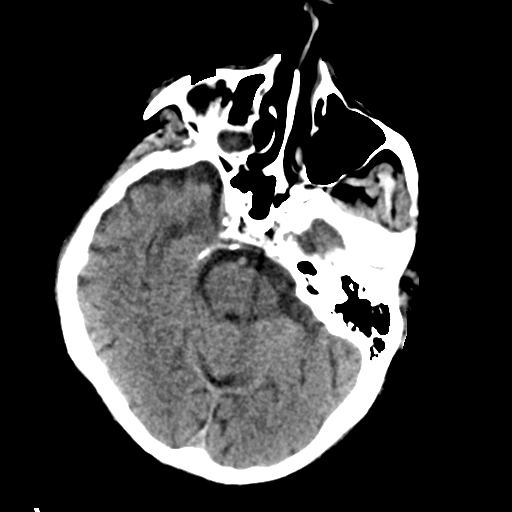
[im 12/34  bone]
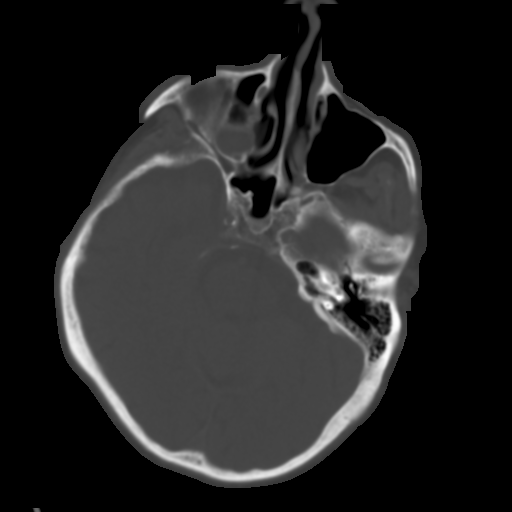
[im 23/34  brain]
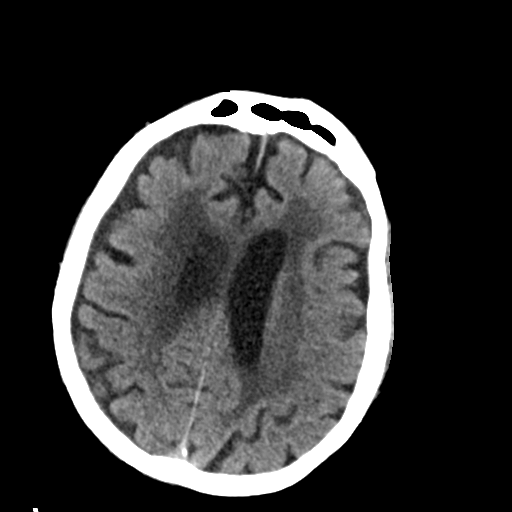

[Series 4: head bone · axial · 0.44mm/px · z∈[-150,-32]mm · 7 of 85 slices shown]
[im 9/85  bone]
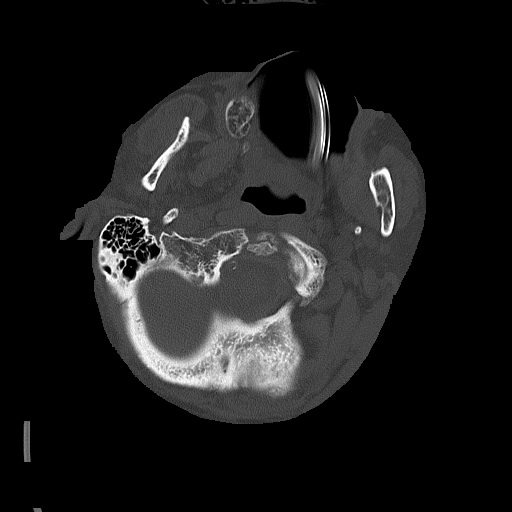
[im 17/85  bone]
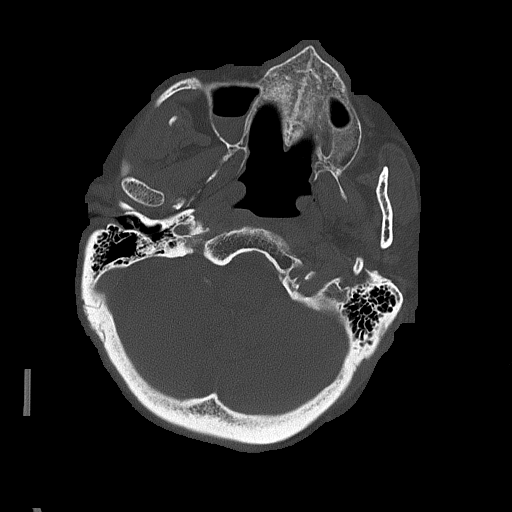
[im 26/85  bone]
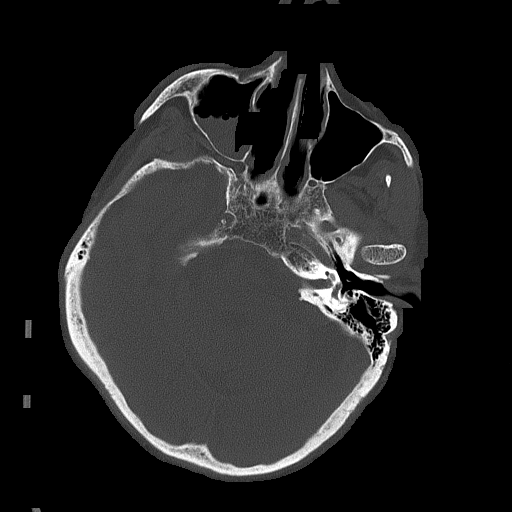
[im 34/85  bone]
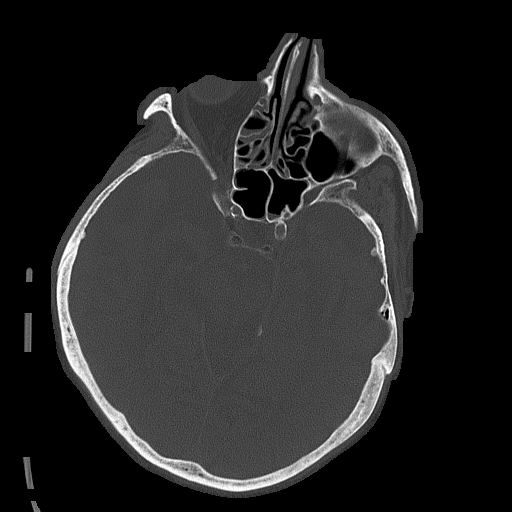
[im 51/85  bone]
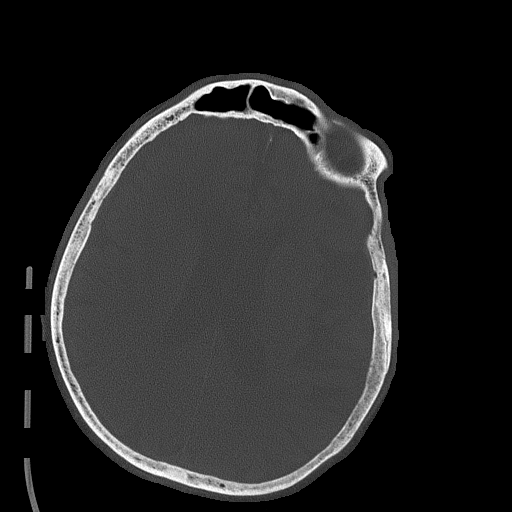
[im 59/85  bone]
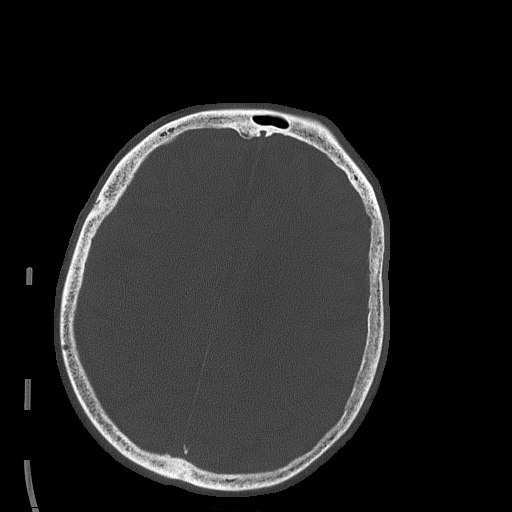
[im 68/85  bone]
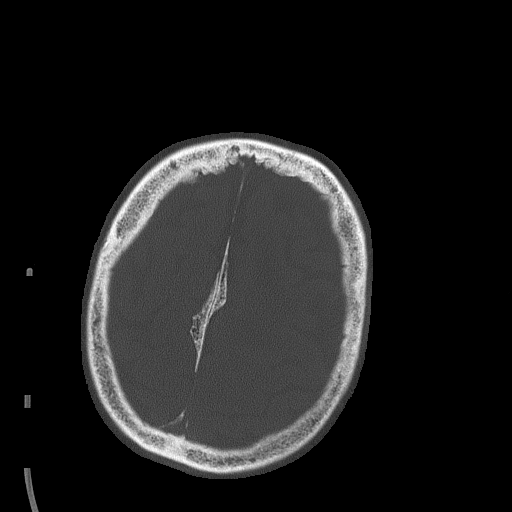

[Series 5: head without cor · coronal · non-contrast · 0.33mm/px · 3 of 66 slices shown]
[im 24/66  brain]
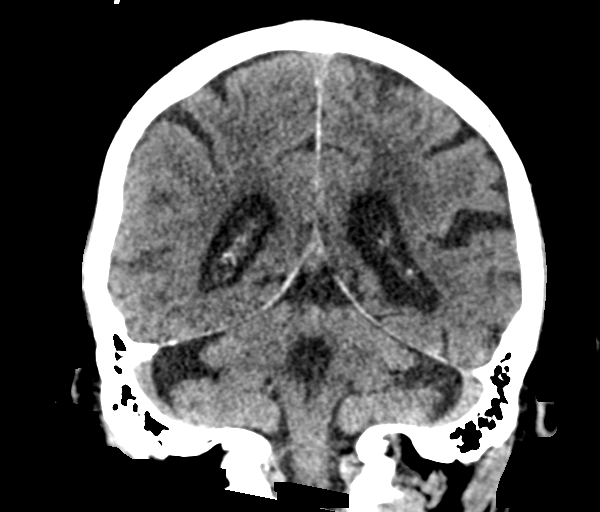
[im 30/66  brain]
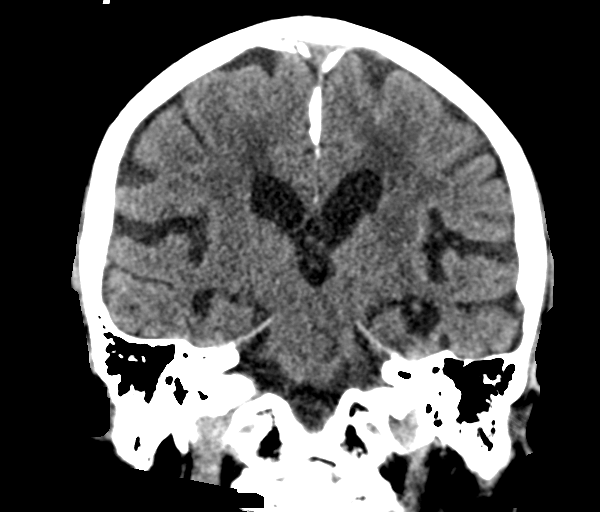
[im 36/66  brain]
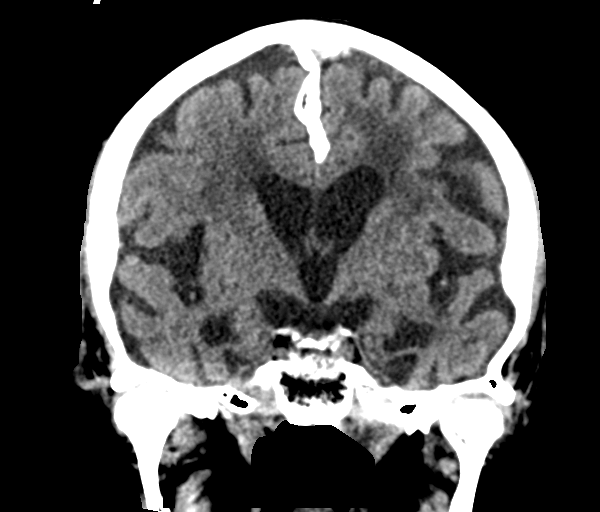

[Series 6: head without sag · sagittal · non-contrast · 0.33mm/px · 3 of 57 slices shown]
[im 21/57  brain]
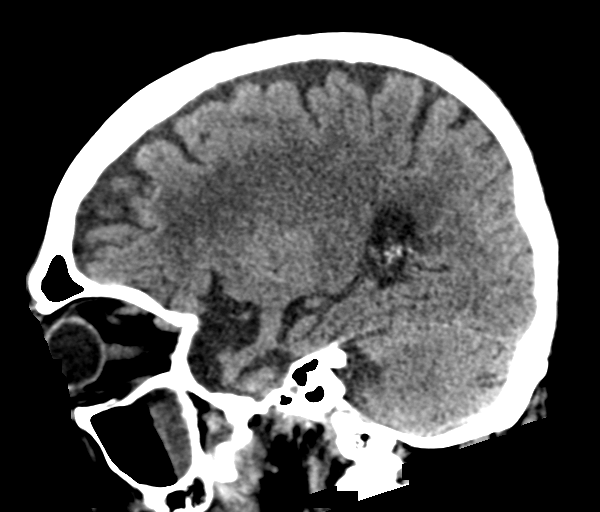
[im 29/57  brain]
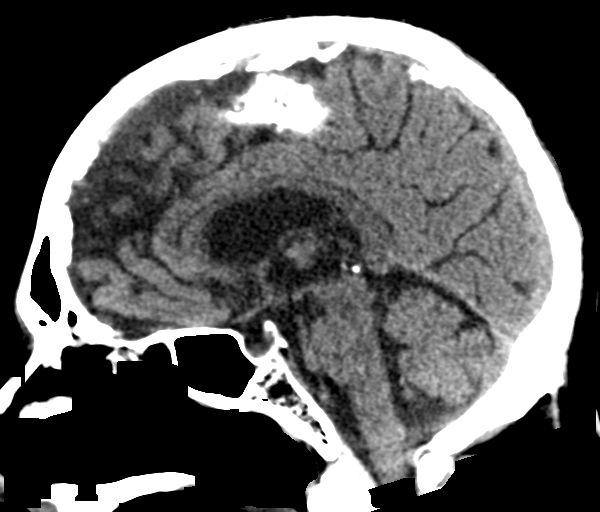
[im 36/57  brain]
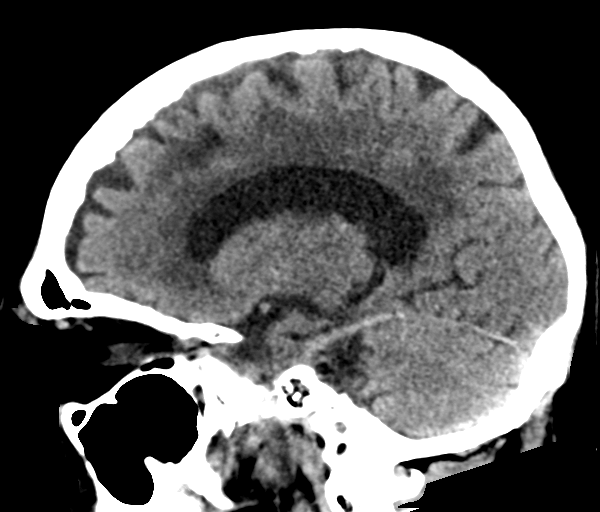

[Series 7: head without ax · axial · non-contrast · 0.39mm/px · z∈[-87,-39]mm · 2 of 34 slices shown]
[im 12/34  brain]
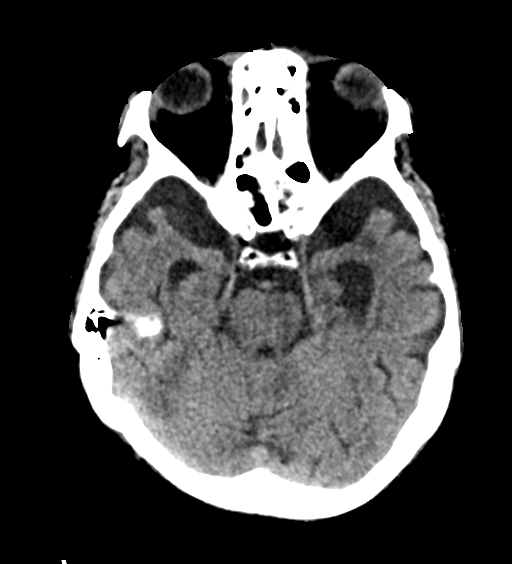
[im 23/34  brain]
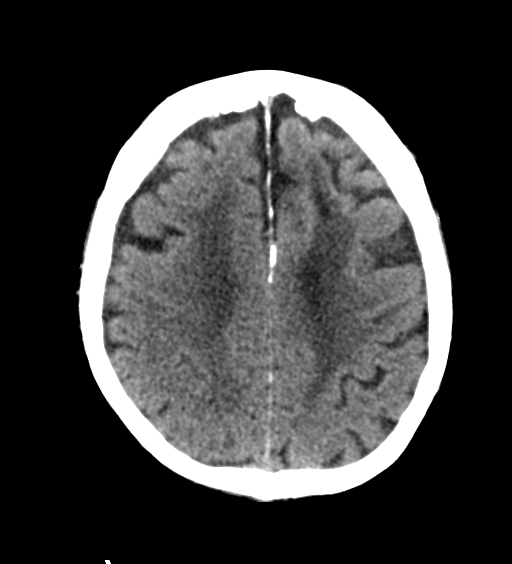

[17 of 47 positions shown; findings below may reference images not displayed]

FINDINGS: Brain: No evidence of acute infarction, hemorrhage, cerebral edema,
mass, mass effect, or midline shift. No hydrocephalus or extra-axial
fluid collection. Periventricular white matter changes, likely the
sequela of chronic small vessel ischemic disease. Unchanged degree
of cerebral atrophy.

Vascular: No hyperdense vessel. Atherosclerotic calcifications in
the intracranial carotid and vertebral arteries.

Skull: Normal. Negative for fracture or focal lesion.

Sinuses/Orbits: Air-fluid level in the right maxillary sinus with
bubbly fluid, with lesser air-fluid levels in the sphenoid sinuses
bilaterally and left anterior ethmoid air cells. Mucosal thickening
in the ethmoid air cells and frontal sinuses.

Other: The mastoid air cells are well aerated.
IMPRESSION: IMPRESSION
1. No acute intracranial process. No etiology seen for the patient's
altered mental status.

2. Air-fluid levels in the right maxillary sinus, with bubbly fluid,
as well as in the sphenoid sinuses and left anterior ethmoid air
cells, which can be seen in the setting of acute sinusitis.
Correlate with symptoms.
